# Patient Record
Sex: Male | Born: 1935 | Race: White | Hispanic: No | Marital: Married | Smoking: Never smoker
Health system: Southern US, Community
[De-identification: ages and names within clinical notes are randomized; demographics above are authoritative.]

## PROBLEM LIST (undated history)

## (undated) DIAGNOSIS — C801 Malignant (primary) neoplasm, unspecified: Secondary | ICD-10-CM

## (undated) DIAGNOSIS — G8929 Other chronic pain: Secondary | ICD-10-CM

## (undated) DIAGNOSIS — M359 Systemic involvement of connective tissue, unspecified: Secondary | ICD-10-CM

## (undated) DIAGNOSIS — E119 Type 2 diabetes mellitus without complications: Secondary | ICD-10-CM

## (undated) DIAGNOSIS — I10 Essential (primary) hypertension: Secondary | ICD-10-CM

---

## 2004-07-27 ENCOUNTER — Other Ambulatory Visit: Payer: Self-pay

## 2004-07-27 ENCOUNTER — Emergency Department: Payer: Self-pay | Admitting: Emergency Medicine

## 2007-04-29 ENCOUNTER — Ambulatory Visit: Payer: Self-pay | Admitting: Gastroenterology

## 2010-12-12 ENCOUNTER — Ambulatory Visit: Payer: Self-pay | Admitting: Gastroenterology

## 2011-07-01 ENCOUNTER — Ambulatory Visit: Payer: Self-pay | Admitting: Family Medicine

## 2012-08-27 ENCOUNTER — Emergency Department: Payer: Self-pay | Admitting: Emergency Medicine

## 2012-08-27 LAB — CBC
HCT: 37.8 % — ABNORMAL LOW (ref 40.0–52.0)
HGB: 12.8 g/dL — ABNORMAL LOW (ref 13.0–18.0)
MCH: 33.6 pg (ref 26.0–34.0)
MCHC: 33.9 g/dL (ref 32.0–36.0)
MCV: 99 fL (ref 80–100)
RDW: 15.1 % — ABNORMAL HIGH (ref 11.5–14.5)
WBC: 9.3 10*3/uL (ref 3.8–10.6)

## 2012-08-27 LAB — COMPREHENSIVE METABOLIC PANEL
Albumin: 4.1 g/dL (ref 3.4–5.0)
Anion Gap: 10 (ref 7–16)
BUN: 20 mg/dL — ABNORMAL HIGH (ref 7–18)
Bilirubin,Total: 0.3 mg/dL (ref 0.2–1.0)
Chloride: 105 mmol/L (ref 98–107)
Creatinine: 1.31 mg/dL — ABNORMAL HIGH (ref 0.60–1.30)
EGFR (Non-African Amer.): 51 — ABNORMAL LOW
Glucose: 126 mg/dL — ABNORMAL HIGH (ref 65–99)
Osmolality: 282 (ref 275–301)
Potassium: 4.3 mmol/L (ref 3.5–5.1)
SGOT(AST): 27 U/L (ref 15–37)
Sodium: 139 mmol/L (ref 136–145)

## 2012-09-04 ENCOUNTER — Ambulatory Visit: Payer: Self-pay | Admitting: Family Medicine

## 2012-09-04 LAB — CBC WITH DIFFERENTIAL/PLATELET
Basophil #: 0 10*3/uL (ref 0.0–0.1)
Eosinophil #: 0.1 10*3/uL (ref 0.0–0.7)
Eosinophil %: 0.9 %
HCT: 39 % — ABNORMAL LOW (ref 40.0–52.0)
Lymphocyte #: 1.1 10*3/uL (ref 1.0–3.6)
MCHC: 33.5 g/dL (ref 32.0–36.0)
MCV: 99 fL (ref 80–100)
Monocyte %: 8.8 %
Neutrophil #: 7 10*3/uL — ABNORMAL HIGH (ref 1.4–6.5)
Neutrophil %: 78.3 %
Platelet: 361 10*3/uL (ref 150–440)
RDW: 15.5 % — ABNORMAL HIGH (ref 11.5–14.5)
WBC: 9 10*3/uL (ref 3.8–10.6)

## 2012-09-04 LAB — BASIC METABOLIC PANEL
Anion Gap: 9 (ref 7–16)
Calcium, Total: 9.3 mg/dL (ref 8.5–10.1)
Chloride: 99 mmol/L (ref 98–107)
Co2: 28 mmol/L (ref 21–32)
EGFR (African American): 59 — ABNORMAL LOW
EGFR (Non-African Amer.): 50 — ABNORMAL LOW
Glucose: 124 mg/dL — ABNORMAL HIGH (ref 65–99)
Osmolality: 276 (ref 275–301)
Potassium: 5.1 mmol/L (ref 3.5–5.1)
Sodium: 136 mmol/L (ref 136–145)

## 2012-09-06 ENCOUNTER — Emergency Department: Payer: Self-pay | Admitting: Emergency Medicine

## 2013-10-05 ENCOUNTER — Ambulatory Visit: Payer: Self-pay | Admitting: Physician Assistant

## 2014-02-25 ENCOUNTER — Ambulatory Visit: Payer: Self-pay | Admitting: Rheumatology

## 2014-10-02 ENCOUNTER — Emergency Department: Payer: Self-pay | Admitting: Internal Medicine

## 2017-05-14 ENCOUNTER — Other Ambulatory Visit: Payer: Self-pay | Admitting: Family Medicine

## 2017-05-14 DIAGNOSIS — R109 Unspecified abdominal pain: Secondary | ICD-10-CM

## 2017-05-16 ENCOUNTER — Ambulatory Visit
Admission: RE | Admit: 2017-05-16 | Discharge: 2017-05-16 | Disposition: A | Discharge status: Home / Self Care | Payer: Federal, State, Local not specified - PPO | Source: Ambulatory Visit | Attending: Family Medicine | Admitting: Family Medicine

## 2017-05-16 DIAGNOSIS — I2510 Atherosclerotic heart disease of native coronary artery without angina pectoris: Secondary | ICD-10-CM | POA: Diagnosis not present

## 2017-05-16 DIAGNOSIS — R109 Unspecified abdominal pain: Secondary | ICD-10-CM | POA: Diagnosis present

## 2017-05-16 DIAGNOSIS — I700 Atherosclerosis of aorta: Secondary | ICD-10-CM | POA: Diagnosis not present

## 2017-05-16 DIAGNOSIS — E119 Type 2 diabetes mellitus without complications: Secondary | ICD-10-CM | POA: Insufficient documentation

## 2017-05-16 DIAGNOSIS — M47817 Spondylosis without myelopathy or radiculopathy, lumbosacral region: Secondary | ICD-10-CM | POA: Insufficient documentation

## 2017-05-16 DIAGNOSIS — K5730 Diverticulosis of large intestine without perforation or abscess without bleeding: Secondary | ICD-10-CM | POA: Insufficient documentation

## 2017-05-16 DIAGNOSIS — K760 Fatty (change of) liver, not elsewhere classified: Secondary | ICD-10-CM | POA: Insufficient documentation

## 2019-07-08 ENCOUNTER — Ambulatory Visit
Admission: RE | Admit: 2019-07-08 | Discharge: 2019-07-08 | Disposition: A | Discharge status: Home / Self Care | Payer: Federal, State, Local not specified - PPO | Source: Ambulatory Visit | Attending: Gastroenterology | Admitting: Gastroenterology

## 2019-07-08 ENCOUNTER — Ambulatory Visit
Admission: RE | Admit: 2019-07-08 | Discharge: 2019-07-08 | Disposition: A | Discharge status: Home / Self Care | Payer: Federal, State, Local not specified - PPO | Attending: Gastroenterology | Admitting: Gastroenterology

## 2019-07-08 ENCOUNTER — Other Ambulatory Visit: Payer: Self-pay

## 2019-07-08 ENCOUNTER — Other Ambulatory Visit: Payer: Self-pay | Admitting: Gastroenterology

## 2019-07-08 DIAGNOSIS — K5900 Constipation, unspecified: Secondary | ICD-10-CM | POA: Insufficient documentation

## 2019-08-05 ENCOUNTER — Other Ambulatory Visit: Payer: Self-pay | Admitting: Gastroenterology

## 2019-08-05 DIAGNOSIS — R1084 Generalized abdominal pain: Secondary | ICD-10-CM

## 2019-08-06 ENCOUNTER — Other Ambulatory Visit: Payer: Self-pay

## 2019-08-06 ENCOUNTER — Ambulatory Visit
Admission: RE | Admit: 2019-08-06 | Discharge: 2019-08-06 | Disposition: A | Discharge status: Home / Self Care | Payer: Federal, State, Local not specified - PPO | Source: Ambulatory Visit | Attending: Gastroenterology | Admitting: Gastroenterology

## 2019-08-06 ENCOUNTER — Other Ambulatory Visit: Payer: Self-pay | Admitting: Gastroenterology

## 2019-08-06 DIAGNOSIS — R1084 Generalized abdominal pain: Secondary | ICD-10-CM | POA: Insufficient documentation

## 2019-08-06 HISTORY — DX: Systemic involvement of connective tissue, unspecified: M35.9

## 2019-08-06 HISTORY — DX: Type 2 diabetes mellitus without complications: E11.9

## 2019-08-06 MED ORDER — IOHEXOL 300 MG/ML  SOLN
100.0000 mL | Freq: Once | INTRAMUSCULAR | Status: AC | PRN
  Administered 2019-08-06: 10:00:00 100 mL via INTRAVENOUS

## 2019-10-21 ENCOUNTER — Inpatient Hospital Stay
Admission: EM | Admit: 2019-10-21 | Discharge: 2019-11-03 | DRG: 200 | Disposition: A | Discharge status: Hospice | Payer: Medicare Other | Source: Ambulatory Visit | Attending: Internal Medicine | Admitting: Internal Medicine

## 2019-10-21 ENCOUNTER — Other Ambulatory Visit: Payer: Self-pay

## 2019-10-21 ENCOUNTER — Emergency Department: Payer: Medicare Other

## 2019-10-21 DIAGNOSIS — Z79899 Other long term (current) drug therapy: Secondary | ICD-10-CM

## 2019-10-21 DIAGNOSIS — E1122 Type 2 diabetes mellitus with diabetic chronic kidney disease: Secondary | ICD-10-CM | POA: Diagnosis present

## 2019-10-21 DIAGNOSIS — J939 Pneumothorax, unspecified: Principal | ICD-10-CM

## 2019-10-21 DIAGNOSIS — J90 Pleural effusion, not elsewhere classified: Secondary | ICD-10-CM

## 2019-10-21 DIAGNOSIS — Z8719 Personal history of other diseases of the digestive system: Secondary | ICD-10-CM

## 2019-10-21 DIAGNOSIS — Z6824 Body mass index (BMI) 24.0-24.9, adult: Secondary | ICD-10-CM

## 2019-10-21 DIAGNOSIS — K810 Acute cholecystitis: Secondary | ICD-10-CM

## 2019-10-21 DIAGNOSIS — Z85828 Personal history of other malignant neoplasm of skin: Secondary | ICD-10-CM

## 2019-10-21 DIAGNOSIS — N183 Chronic kidney disease, stage 3 unspecified: Secondary | ICD-10-CM | POA: Diagnosis present

## 2019-10-21 DIAGNOSIS — G629 Polyneuropathy, unspecified: Secondary | ICD-10-CM | POA: Diagnosis present

## 2019-10-21 DIAGNOSIS — N1831 Chronic kidney disease, stage 3a: Secondary | ICD-10-CM | POA: Diagnosis not present

## 2019-10-21 DIAGNOSIS — N179 Acute kidney failure, unspecified: Secondary | ICD-10-CM | POA: Diagnosis present

## 2019-10-21 DIAGNOSIS — R0602 Shortness of breath: Secondary | ICD-10-CM

## 2019-10-21 DIAGNOSIS — E440 Moderate protein-calorie malnutrition: Secondary | ICD-10-CM | POA: Diagnosis present

## 2019-10-21 DIAGNOSIS — M069 Rheumatoid arthritis, unspecified: Secondary | ICD-10-CM | POA: Diagnosis present

## 2019-10-21 DIAGNOSIS — K5909 Other constipation: Secondary | ICD-10-CM | POA: Diagnosis present

## 2019-10-21 DIAGNOSIS — Z803 Family history of malignant neoplasm of breast: Secondary | ICD-10-CM

## 2019-10-21 DIAGNOSIS — T859XXA Unspecified complication of internal prosthetic device, implant and graft, initial encounter: Secondary | ICD-10-CM

## 2019-10-21 DIAGNOSIS — I10 Essential (primary) hypertension: Secondary | ICD-10-CM | POA: Diagnosis present

## 2019-10-21 DIAGNOSIS — Z8619 Personal history of other infectious and parasitic diseases: Secondary | ICD-10-CM

## 2019-10-21 DIAGNOSIS — J984 Other disorders of lung: Secondary | ICD-10-CM

## 2019-10-21 DIAGNOSIS — Z7984 Long term (current) use of oral hypoglycemic drugs: Secondary | ICD-10-CM

## 2019-10-21 DIAGNOSIS — D62 Acute posthemorrhagic anemia: Secondary | ICD-10-CM | POA: Diagnosis not present

## 2019-10-21 DIAGNOSIS — K567 Ileus, unspecified: Secondary | ICD-10-CM | POA: Diagnosis present

## 2019-10-21 DIAGNOSIS — D473 Essential (hemorrhagic) thrombocythemia: Secondary | ICD-10-CM | POA: Diagnosis not present

## 2019-10-21 DIAGNOSIS — Z09 Encounter for follow-up examination after completed treatment for conditions other than malignant neoplasm: Secondary | ICD-10-CM

## 2019-10-21 DIAGNOSIS — D72829 Elevated white blood cell count, unspecified: Secondary | ICD-10-CM | POA: Diagnosis not present

## 2019-10-21 DIAGNOSIS — M0519 Rheumatoid lung disease with rheumatoid arthritis of multiple sites: Secondary | ICD-10-CM | POA: Diagnosis present

## 2019-10-21 DIAGNOSIS — Z66 Do not resuscitate: Secondary | ICD-10-CM | POA: Diagnosis present

## 2019-10-21 DIAGNOSIS — F05 Delirium due to known physiological condition: Secondary | ICD-10-CM | POA: Diagnosis not present

## 2019-10-21 DIAGNOSIS — Z20822 Contact with and (suspected) exposure to covid-19: Secondary | ICD-10-CM | POA: Diagnosis present

## 2019-10-21 DIAGNOSIS — I129 Hypertensive chronic kidney disease with stage 1 through stage 4 chronic kidney disease, or unspecified chronic kidney disease: Secondary | ICD-10-CM | POA: Diagnosis present

## 2019-10-21 DIAGNOSIS — Z781 Physical restraint status: Secondary | ICD-10-CM

## 2019-10-21 DIAGNOSIS — J9811 Atelectasis: Secondary | ICD-10-CM | POA: Diagnosis present

## 2019-10-21 DIAGNOSIS — K920 Hematemesis: Secondary | ICD-10-CM | POA: Diagnosis present

## 2019-10-21 DIAGNOSIS — G8929 Other chronic pain: Secondary | ICD-10-CM | POA: Diagnosis present

## 2019-10-21 DIAGNOSIS — Z87891 Personal history of nicotine dependence: Secondary | ICD-10-CM

## 2019-10-21 DIAGNOSIS — J9382 Other air leak: Secondary | ICD-10-CM | POA: Diagnosis not present

## 2019-10-21 DIAGNOSIS — Z515 Encounter for palliative care: Secondary | ICD-10-CM | POA: Diagnosis not present

## 2019-10-21 DIAGNOSIS — Z9689 Presence of other specified functional implants: Secondary | ICD-10-CM

## 2019-10-21 DIAGNOSIS — Z8249 Family history of ischemic heart disease and other diseases of the circulatory system: Secondary | ICD-10-CM

## 2019-10-21 DIAGNOSIS — Z7952 Long term (current) use of systemic steroids: Secondary | ICD-10-CM

## 2019-10-21 DIAGNOSIS — J910 Malignant pleural effusion: Secondary | ICD-10-CM | POA: Diagnosis present

## 2019-10-21 DIAGNOSIS — Z882 Allergy status to sulfonamides status: Secondary | ICD-10-CM

## 2019-10-21 DIAGNOSIS — M549 Dorsalgia, unspecified: Secondary | ICD-10-CM | POA: Diagnosis present

## 2019-10-21 DIAGNOSIS — K828 Other specified diseases of gallbladder: Secondary | ICD-10-CM | POA: Diagnosis present

## 2019-10-21 DIAGNOSIS — E875 Hyperkalemia: Secondary | ICD-10-CM | POA: Diagnosis present

## 2019-10-21 HISTORY — DX: Essential (primary) hypertension: I10

## 2019-10-21 HISTORY — DX: Other chronic pain: G89.29

## 2019-10-21 HISTORY — DX: Malignant (primary) neoplasm, unspecified: C80.1

## 2019-10-21 LAB — BASIC METABOLIC PANEL
Anion gap: 15 (ref 5–15)
BUN: 26 mg/dL — ABNORMAL HIGH (ref 8–23)
CO2: 25 mmol/L (ref 22–32)
Calcium: 8.7 mg/dL — ABNORMAL LOW (ref 8.9–10.3)
Chloride: 96 mmol/L — ABNORMAL LOW (ref 98–111)
Creatinine, Ser: 1.44 mg/dL — ABNORMAL HIGH (ref 0.61–1.24)
GFR calc Af Amer: 51 mL/min — ABNORMAL LOW (ref >60)
GFR calc non Af Amer: 44 mL/min — ABNORMAL LOW (ref >60)
Glucose, Bld: 195 mg/dL — ABNORMAL HIGH (ref 70–99)
Potassium: 5.4 mmol/L — ABNORMAL HIGH (ref 3.5–5.1)
Sodium: 136 mmol/L (ref 135–145)

## 2019-10-21 LAB — CBC
HCT: 32.9 % — ABNORMAL LOW (ref 39.0–52.0)
Hemoglobin: 10.4 g/dL — ABNORMAL LOW (ref 13.0–17.0)
MCH: 29.6 pg (ref 26.0–34.0)
MCHC: 31.6 g/dL (ref 30.0–36.0)
MCV: 93.7 fL (ref 80.0–100.0)
Platelets: 613 10*3/uL — ABNORMAL HIGH (ref 150–400)
RBC: 3.51 MIL/uL — ABNORMAL LOW (ref 4.22–5.81)
RDW: 12.4 % (ref 11.5–15.5)
WBC: 13.8 10*3/uL — ABNORMAL HIGH (ref 4.0–10.5)
nRBC: 0 % (ref 0.0–0.2)

## 2019-10-21 LAB — APTT
aPTT: 32 seconds (ref 24–36)
aPTT: 32 seconds (ref 24–36)

## 2019-10-21 LAB — GLUCOSE, CAPILLARY: Glucose-Capillary: 165 mg/dL — ABNORMAL HIGH (ref 70–99)

## 2019-10-21 LAB — RESPIRATORY PANEL BY RT PCR (FLU A&B, COVID)
Influenza A by PCR: NEGATIVE
Influenza B by PCR: NEGATIVE
SARS Coronavirus 2 by RT PCR: NEGATIVE

## 2019-10-21 LAB — BODY FLUID CELL COUNT WITH DIFFERENTIAL
Eos, Fluid: 1 %
Lymphs, Fluid: 51 %
Monocyte-Macrophage-Serous Fluid: 10 %
Neutrophil Count, Fluid: 38 %
Total Nucleated Cell Count, Fluid: 995 cu mm

## 2019-10-21 LAB — PROTIME-INR
INR: 1.1 (ref 0.8–1.2)
Prothrombin Time: 14.5 seconds (ref 11.4–15.2)

## 2019-10-21 LAB — AMYLASE, PLEURAL OR PERITONEAL FLUID: Amylase, Fluid: 43 U/L

## 2019-10-21 LAB — LACTATE DEHYDROGENASE, PLEURAL OR PERITONEAL FLUID: LD, Fluid: 402 U/L — ABNORMAL HIGH (ref 3–23)

## 2019-10-21 LAB — GLUCOSE, PLEURAL OR PERITONEAL FLUID: Glucose, Fluid: 108 mg/dL

## 2019-10-21 LAB — TROPONIN I (HIGH SENSITIVITY)
Troponin I (High Sensitivity): 8 ng/L (ref <18)
Troponin I (High Sensitivity): 7 ng/L (ref <18)

## 2019-10-21 LAB — PROTEIN, PLEURAL OR PERITONEAL FLUID: Total protein, fluid: 4.4 g/dL

## 2019-10-21 LAB — LACTATE DEHYDROGENASE: LDH: 128 U/L (ref 98–192)

## 2019-10-21 MED ORDER — SODIUM ZIRCONIUM CYCLOSILICATE 5 G PO PACK
5.0000 g | PACK | Freq: Once | ORAL | Status: AC
  Administered 2019-10-21: 5 g via ORAL
  Filled 2019-10-21: qty 1

## 2019-10-21 MED ORDER — MORPHINE SULFATE (PF) 2 MG/ML IV SOLN
1.0000 mg | INTRAVENOUS | Status: DC | PRN
  Administered 2019-10-21 (×2): 1 mg via INTRAVENOUS
  Filled 2019-10-21 (×2): qty 1

## 2019-10-21 MED ORDER — OXYCODONE-ACETAMINOPHEN 5-325 MG PO TABS
2.0000 | ORAL_TABLET | Freq: Four times a day (QID) | ORAL | Status: DC | PRN
  Administered 2019-10-22 (×2): 2 via ORAL
  Filled 2019-10-21 (×2): qty 2

## 2019-10-21 MED ORDER — ONDANSETRON HCL 4 MG/2ML IJ SOLN
4.0000 mg | Freq: Three times a day (TID) | INTRAMUSCULAR | Status: DC | PRN
  Administered 2019-10-22 – 2019-10-23 (×2): 4 mg via INTRAVENOUS
  Filled 2019-10-21 (×3): qty 2

## 2019-10-21 MED ORDER — ACETAMINOPHEN 325 MG PO TABS
650.0000 mg | ORAL_TABLET | Freq: Four times a day (QID) | ORAL | Status: DC | PRN
  Administered 2019-10-29 – 2019-10-31 (×2): 650 mg via ORAL
  Filled 2019-10-21 (×3): qty 2

## 2019-10-21 MED ORDER — SODIUM CHLORIDE 0.9% FLUSH
3.0000 mL | Freq: Once | INTRAVENOUS | Status: AC
  Administered 2019-10-31: 3 mL via INTRAVENOUS

## 2019-10-21 MED ORDER — LINACLOTIDE 145 MCG PO CAPS
145.0000 ug | ORAL_CAPSULE | Freq: Every morning | ORAL | Status: DC
  Administered 2019-10-22 – 2019-11-02 (×9): 145 ug via ORAL
  Filled 2019-10-21 (×12): qty 1

## 2019-10-21 MED ORDER — HYDROCORTISONE NA SUCCINATE PF 100 MG IJ SOLR
100.0000 mg | Freq: Once | INTRAMUSCULAR | Status: AC
  Administered 2019-10-21: 100 mg via INTRAVENOUS
  Filled 2019-10-21: qty 2

## 2019-10-21 MED ORDER — HYDRALAZINE HCL 25 MG PO TABS
25.0000 mg | ORAL_TABLET | Freq: Three times a day (TID) | ORAL | Status: DC | PRN
  Administered 2019-10-21: 25 mg via ORAL
  Filled 2019-10-21 (×2): qty 1

## 2019-10-21 MED ORDER — OXYCODONE-ACETAMINOPHEN 5-325 MG PO TABS
1.0000 | ORAL_TABLET | ORAL | Status: DC | PRN
  Administered 2019-10-21 (×2): 1 via ORAL
  Filled 2019-10-21 (×2): qty 1

## 2019-10-21 MED ORDER — POLYETHYLENE GLYCOL 3350 17 G PO PACK
17.0000 g | PACK | Freq: Every day | ORAL | Status: DC | PRN
  Filled 2019-10-21 (×2): qty 1

## 2019-10-21 MED ORDER — HYDROMORPHONE HCL 1 MG/ML IJ SOLN: 1.0000 mg | INTRAMUSCULAR | Status: AC

## 2019-10-21 MED ORDER — FINASTERIDE 5 MG PO TABS
5.0000 mg | ORAL_TABLET | Freq: Every day | ORAL | Status: DC
  Administered 2019-10-21 – 2019-11-02 (×11): 5 mg via ORAL
  Filled 2019-10-21 (×11): qty 1

## 2019-10-21 MED ORDER — ALBUTEROL SULFATE HFA 108 (90 BASE) MCG/ACT IN AERS
2.0000 | INHALATION_SPRAY | RESPIRATORY_TRACT | Status: DC | PRN
  Filled 2019-10-21: qty 6.7

## 2019-10-21 MED ORDER — HYDROMORPHONE HCL 1 MG/ML IJ SOLN
INTRAMUSCULAR | Status: AC
  Administered 2019-10-21: 1 mg via INTRAVENOUS
  Filled 2019-10-21: qty 1

## 2019-10-21 MED ORDER — INSULIN ASPART 100 UNIT/ML ~~LOC~~ SOLN
0.0000 [IU] | Freq: Every day | SUBCUTANEOUS | Status: DC
  Administered 2019-10-24: 2 [IU] via SUBCUTANEOUS
  Administered 2019-10-25: 3 [IU] via SUBCUTANEOUS
  Administered 2019-10-26: 4 [IU] via SUBCUTANEOUS
  Administered 2019-10-27: 3 [IU] via SUBCUTANEOUS
  Administered 2019-10-28: 2 [IU] via SUBCUTANEOUS
  Administered 2019-10-29: 4 [IU] via SUBCUTANEOUS
  Administered 2019-10-30 – 2019-10-31 (×2): 2 [IU] via SUBCUTANEOUS
  Administered 2019-11-01: 4 [IU] via SUBCUTANEOUS
  Filled 2019-10-21 (×10): qty 1

## 2019-10-21 MED ORDER — MORPHINE SULFATE (PF) 2 MG/ML IV SOLN
1.0000 mg | INTRAVENOUS | Status: DC | PRN
  Administered 2019-10-22 – 2019-10-23 (×3): 1 mg via INTRAVENOUS
  Filled 2019-10-21 (×3): qty 1

## 2019-10-21 MED ORDER — TOFACITINIB CITRATE 5 MG PO TABS
5.0000 mg | ORAL_TABLET | Freq: Two times a day (BID) | ORAL | Status: DC
  Administered 2019-10-29 – 2019-11-02 (×7): 5 mg via ORAL
  Filled 2019-10-21 (×8): qty 1

## 2019-10-21 MED ORDER — LIDOCAINE-EPINEPHRINE 2 %-1:100000 IJ SOLN
30.0000 mL | Freq: Once | INTRAMUSCULAR | Status: AC
  Administered 2019-10-21: 30 mL
  Filled 2019-10-21: qty 2

## 2019-10-21 MED ORDER — INSULIN ASPART 100 UNIT/ML ~~LOC~~ SOLN
0.0000 [IU] | Freq: Three times a day (TID) | SUBCUTANEOUS | Status: DC
  Administered 2019-10-22: 3 [IU] via SUBCUTANEOUS
  Administered 2019-10-22: 1 [IU] via SUBCUTANEOUS
  Administered 2019-10-23 – 2019-10-24 (×3): 2 [IU] via SUBCUTANEOUS
  Administered 2019-10-24: 5 [IU] via SUBCUTANEOUS
  Administered 2019-10-25: 3 [IU] via SUBCUTANEOUS
  Administered 2019-10-25: 2 [IU] via SUBCUTANEOUS
  Administered 2019-10-25: 5 [IU] via SUBCUTANEOUS
  Administered 2019-10-26: 1 [IU] via SUBCUTANEOUS
  Administered 2019-10-26: 3 [IU] via SUBCUTANEOUS
  Administered 2019-10-26: 5 [IU] via SUBCUTANEOUS
  Administered 2019-10-27: 2 [IU] via SUBCUTANEOUS
  Administered 2019-10-27: 5 [IU] via SUBCUTANEOUS
  Administered 2019-10-27: 3 [IU] via SUBCUTANEOUS
  Administered 2019-10-28: 1 [IU] via SUBCUTANEOUS
  Administered 2019-10-28: 3 [IU] via SUBCUTANEOUS
  Administered 2019-10-28: 5 [IU] via SUBCUTANEOUS
  Administered 2019-10-29: 2 [IU] via SUBCUTANEOUS
  Administered 2019-10-29: 7 [IU] via SUBCUTANEOUS
  Administered 2019-10-29 – 2019-10-31 (×5): 2 [IU] via SUBCUTANEOUS
  Administered 2019-10-31 (×2): 5 [IU] via SUBCUTANEOUS
  Administered 2019-11-01: 2 [IU] via SUBCUTANEOUS
  Administered 2019-11-01: 3 [IU] via SUBCUTANEOUS
  Administered 2019-11-01 – 2019-11-02 (×2): 2 [IU] via SUBCUTANEOUS
  Filled 2019-10-21 (×32): qty 1

## 2019-10-21 MED ORDER — PREDNISONE 20 MG PO TABS
20.0000 mg | ORAL_TABLET | Freq: Every day | ORAL | Status: DC
  Administered 2019-10-22 – 2019-11-02 (×10): 20 mg via ORAL
  Filled 2019-10-21 (×10): qty 1

## 2019-10-21 MED ORDER — DM-GUAIFENESIN ER 30-600 MG PO TB12
1.0000 | ORAL_TABLET | Freq: Two times a day (BID) | ORAL | Status: DC
  Administered 2019-10-21 – 2019-11-03 (×17): 1 via ORAL
  Filled 2019-10-21 (×28): qty 1

## 2019-10-21 NOTE — ED Notes (Addendum)
Pt c/o pain in left side, requesting prn pain medication, pt advised that he was given prn morphine approx an hour ago

## 2019-10-21 NOTE — H&P (Signed)
History and Physical    David Cisneros [id number] DOB: Nov 22, 1930 DOA: 10/21/2019  Referring MD/NP/PA:   PCP: Valera Castle, MD   Patient coming from:  The patient is coming from home.  At baseline, pt is independent for most of ADL.        Chief Complaint: Shortness of breath  HPI: David Cisneros is a 85 y.o. male with medical history significant of former smoker, hypertension, diabetes mellitus, rheumatoid arthritis, chronic back pain, CKD stage IIIa, who presents with shortness of breath.  Patient states that he has been having shortness of breath for more than 2 weeks, which has worsened in the past several days. Initially no chest pain reported to ED physician, but developed chest pain after chest tubes were placed on the left side.  No cough, fever or chills.  Patient is constipated, but no nausea, vomiting, diarrhea or abdominal pain.  No symptoms of UTI.  No unilateral weakness. Patient was found to have a large left-sided pleural effusion by CXR. Left side chest tube is placed in ED.   ED Course: pt was found to have WBC 13.8, negative RVP for COVID-19 test, potassium 5.4, slightly worsening renal function, temperature normal, blood pressure 180/81, tachycardia, oxygen sat 96% 2 L nasal cannula oxygen. Pt is based on stepdown bed for observation.  # CT-chest showed: large left pleural effusion with compressive atelectasis of the majority of the left lung.  Review of Systems:   General: no fevers, chills, no body weight gain, has fatigue HEENT: no blurry vision, hearing changes or sore throat Respiratory: has dyspnea, no coughing, wheezing CV: has chest pain, no palpitations GI: no nausea, vomiting, abdominal pain, diarrhea, constipation GU: no dysuria, burning on urination, increased urinary frequency, hematuria  Ext: no leg edema Neuro: no unilateral weakness, numbness, or tingling, no vision change or hearing loss Skin: no rash, no skin tear. MSK: No muscle  spasm, no deformity, no limitation of range of movement in spin Heme: No easy bruising.  Travel history: No recent long distant travel.  Allergy:  Allergies  Allergen Reactions  . Sulfa Antibiotics Rash    Past Medical History:  Diagnosis Date  . Cancer ([facility])    skin   . Chronic back pain   . Collagen vascular disease (HCC)    RA  . Diabetes mellitus without complication ([place])   . Hypertension     History reviewed. No pertinent surgical history.  Social History:  reports that he has never smoked. He has never used smokeless tobacco. He reports previous alcohol use. He reports previous drug use.  Family History:  Family History  Problem Relation Age of Onset  . Breast cancer Sister   . Heart attack Brother      Prior to Admission medications   Not on File    Physical Exam: Vitals:   10/21/19 1400 10/21/19 1426 10/21/19 1430 10/21/19 1500  BP: (!) 180/81  (!) 198/95 (!) 191/98  Pulse: 96  94 93  Resp: (!) 21  (!) 29 14  Temp:  98.1 F (36.7 C)    TempSrc:      SpO2: 96%  92% 93%  Weight:      Height:       General: Not in acute distress HEENT:       Eyes: PERRL, EOMI, no scleral icterus.       ENT: No discharge from the ears and nose, no pharynx injection, no tonsillar enlargement.  Neck: No JVD, no bruit, no mass felt. Heme: No neck lymph node enlargement. Cardiac: S1/S2, RRR, No murmurs, No gallops or rubs. Respiratory: Decreased air movement on the left side. S/p of chest tube placement. GI: Soft, nondistended, nontender, no rebound pain, no organomegaly, BS present. GU: No hematuria Ext: No pitting leg edema bilaterally. 2+DP/PT pulse bilaterally. Musculoskeletal: No joint deformities, No joint redness or warmth, no limitation of ROM in spin. Skin: No rashes.  Neuro: Alert, oriented X3, cranial nerves II-XII grossly intact, moves all extremities normally.  Psych: Patient is not psychotic, no suicidal or hemocidal ideation.  Labs on Admission: I  have personally reviewed following labs and imaging studies  CBC: Recent Labs  Lab 10/21/19 1054  WBC 13.8*  HGB 10.4*  HCT 32.9*  MCV 93.7  PLT 123XX123*   Basic Metabolic Panel: Recent Labs  Lab 10/21/19 1054  NA 136  K 5.4*  CL 96*  CO2 25  GLUCOSE 195*  BUN 26*  CREATININE 1.44*  CALCIUM 8.7*   GFR: Estimated Creatinine Clearance: 32 mL/min (A) (by C-G formula based on SCr of 1.44 mg/dL (H)). Liver Function Tests: No results for input(s): AST, ALT, ALKPHOS, BILITOT, PROT, ALBUMIN in the last 168 hours. No results for input(s): LIPASE, AMYLASE in the last 168 hours. No results for input(s): AMMONIA in the last 168 hours. Coagulation Profile: Recent Labs  Lab 10/21/19 1353  INR 1.1   Cardiac Enzymes: No results for input(s): CKTOTAL, CKMB, CKMBINDEX, TROPONINI in the last 168 hours. BNP (last 3 results) No results for input(s): PROBNP in the last 8760 hours. HbA1C: No results for input(s): HGBA1C in the last 72 hours. CBG: No results for input(s): GLUCAP in the last 168 hours. Lipid Profile: No results for input(s): CHOL, HDL, LDLCALC, TRIG, CHOLHDL, LDLDIRECT in the last 72 hours. Thyroid Function Tests: No results for input(s): TSH, T4TOTAL, FREET4, T3FREE, THYROIDAB in the last 72 hours. Anemia Panel: No results for input(s): VITAMINB12, FOLATE, FERRITIN, TIBC, IRON, RETICCTPCT in the last 72 hours. Urine analysis: No results found for: COLORURINE, APPEARANCEUR, LABSPEC, PHURINE, GLUCOSEU, HGBUR, BILIRUBINUR, KETONESUR, PROTEINUR, UROBILINOGEN, NITRITE, LEUKOCYTESUR Sepsis Labs: @LABRCNTIP (procalcitonin:4,lacticidven:4) ) Recent Results (from the past 240 hour(s))  Respiratory Panel by RT PCR (Flu A&B, Covid) - Nasopharyngeal Swab     Status: None   Collection Time: 10/21/19 12:33 PM   Specimen: Nasopharyngeal Swab  Result Value Ref Range Status   SARS Coronavirus 2 by RT PCR NEGATIVE NEGATIVE Final    Comment: (NOTE) SARS-CoV-2 target nucleic acids are  NOT DETECTED. The SARS-CoV-2 RNA is generally detectable in upper respiratoy specimens during the acute phase of infection. The lowest concentration of SARS-CoV-2 viral copies this assay can detect is 131 copies/mL. A negative result does not preclude SARS-Cov-2 infection and should not be used as the sole basis for treatment or other patient management decisions. A negative result may occur with  improper specimen collection/handling, submission of specimen other than nasopharyngeal swab, presence of viral mutation(s) within the areas targeted by this assay, and inadequate number of viral copies (<131 copies/mL). A negative result must be combined with clinical observations, patient history, and epidemiological information. The expected result is Negative. Fact Sheet for Patients:  PinkCheek.be Fact Sheet for Healthcare Providers:  GravelBags.it This test is not yet ap proved or cleared by the [place] FDA and  has been authorized for detection and/or diagnosis of SARS-CoV-2 by FDA under an Emergency Use Authorization (EUA). This EUA will remain  in effect (meaning this  test can be used) for the duration of the COVID-19 declaration under Section 564(b)(1) of the Act, 21 U.S.C. section 360bbb-3(b)(1), unless the authorization is terminated or revoked sooner.    Influenza A by PCR NEGATIVE NEGATIVE Final   Influenza B by PCR NEGATIVE NEGATIVE Final    Comment: (NOTE) The Xpert Xpress SARS-CoV-2/FLU/RSV assay is intended as an aid in  the diagnosis of influenza from Nasopharyngeal swab specimens and  should not be used as a sole basis for treatment. Nasal washings and  aspirates are unacceptable for Xpert Xpress SARS-CoV-2/FLU/RSV  testing. Fact Sheet for Patients: PinkCheek.be Fact Sheet for Healthcare Providers: GravelBags.it This test is not yet approved or  cleared by the [place] FDA and  has been authorized for detection and/or diagnosis of SARS-CoV-2 by  FDA under an Emergency Use Authorization (EUA). This EUA will remain  in effect (meaning this test can be used) for the duration of the  Covid-19 declaration under Section 564(b)(1) of the Act, 21  U.S.C. section 360bbb-3(b)(1), unless the authorization is  terminated or revoked. Performed at [facility], [address] [place], [place] [address]      Radiological Exams on Admission: DG Chest 2 View  Result Date: 10/21/2019 CLINICAL DATA:  85 year old male with shortness of breath. Patient is not able to raise his left arm. EXAM: CHEST - 2 VIEW COMPARISON:  Chest radiograph dated 10/02/2014. FINDINGS: There is a large left pleural effusion with compressive atelectasis of the majority of the left lung. A small aerated portion of the left upper lobe remains. The right lung is clear. No pneumothorax. The cardiac borders are silhouetted. Atherosclerotic calcification of the aorta. No acute osseous pathology. IMPRESSION: Large left pleural effusion with compressive atelectasis of the majority of the left lung. Pneumonia is not excluded. Electronically Signed   By: Anner Crete M.D.   On: 10/21/2019 11:24   CT Chest Wo Contrast  Result Date: 10/21/2019 CLINICAL DATA:  Abnormal x-ray EXAM: CT CHEST WITHOUT CONTRAST TECHNIQUE: Multidetector CT imaging of the chest was performed following the standard protocol without IV contrast. COMPARISON:  None. FINDINGS: Cardiovascular: Normal heart size. Coronary artery calcification. No significant pericardial effusion. Calcified plaque along the thoracic aorta. Mediastinum/Nodes: Rightward mediastinal shift. No mediastinal or axillary adenopathy. Unremarkable thyroid. Lungs/Pleura: Large left pleural effusion with near complete atelectasis of the left lung. There is partial aeration of the left upper lobe. Right lung remains aerated with  chronic interstitial changes at the lung base. Upper Abdomen: No acute abnormality. Musculoskeletal: No chest wall mass or significant osseous abnormality. IMPRESSION: Large left pleural effusion with compressive atelectasis of the majority of the left lung. Aortic Atherosclerosis (ICD10-I70.0). Electronically Signed   By: Macy Mis M.D.   On: 10/21/2019 12:35   DG Chest Portable 1 View  Result Date: 10/21/2019 CLINICAL DATA:  Left-sided chest tube placement. EXAM: PORTABLE CHEST 1 VIEW COMPARISON:  CT chest and chest x-ray from same day. FINDINGS: New left-sided chest tube at the lung base. Resolved left pleural effusion. Small left pneumothorax. Hazy density throughout the left lung. The right lung is clear. Normal heart size. No acute osseous abnormality. IMPRESSION: 1. Interval placement of a left-sided chest tube with resolved left pleural effusion. 2. Small left pneumothorax, presumably ex vacuo due to incomplete re-expansion of the left lung. 3. Hazy density throughout the left lung could reflect atelectasis and/or re-expansion pulmonary edema. Electronically Signed   By: Titus Dubin M.D.   On: 10/21/2019 14:11  EKG: Independently reviewed.  Sinus rhythm, QTC 430, LAE, low voltage  Assessment/Plan Principal Problem:   Pleural effusion on left Active Problems:   Hyperkalemia   Hypertension   Chronic back pain   CKD (chronic kidney disease), stage IIIa   Leukocytosis   Pneumothorax on left   Rheumatoid arthritis (HCC)   Pleural effusion on left: Etiology is not clear.  Patient is s/p of left chest tube placement. Per EDP, approximately 3 L of fluid were removed. -place on SDU for obs -pain control: prn Percocet, morphine and Tylenol -As needed albuterol inhaler -As needed Mucinex for cough -f/u pleural fluid analysis  Hyperkalemia: K 5.4 -give 5 g of Lokelma  Hypertension: Blood pressure is elevated 180/81, likely due to pain.  Patient is not taking medication for  blood pressure at home. -prn hydralazine -may start oral med if blood pressure elevation is a persistent  Chronic back pain: -pt is on pain meds as above which should cover  CKD (chronic kidney disease), stage IIIa: Close to baseline.  Baseline creatinine 1.33.  His creatinine is 1.44, BUN 26. -f/u by BMP  Leukocytosis: WBC  13.8. no fever.  May be due to steroid use. -f/u by CBC -f/u Blood culture  Rheumatoid arthritis: Patient is taking Morrie Sheldon and prednisone at home.  -will continue home meds -increased prednisone dose from 10 to 20 mg daily -Give Solu-Cortef 100 mg x 1 as a stress dose  Pneumothorax on left: small.  -f/p of chest tube    DVT ppx: SCD Code Status: Full code Family Communication: None at bed side.   Disposition Plan:  Anticipate discharge back to previous home environment Consults called:  none Admission status:  SDU/obs  Date of Service 10/21/2019    [facility]   If 7PM-7AM, please contact night-coverage [email or web address] Password [email or web address] 10/21/2019, 3:40 PM

## 2019-10-21 NOTE — ED Triage Notes (Signed)
Pt sent from MD office, states he was there today for injections in his back for chronic pain states he was sent here due to SOB for the past 2 weeks and HTN, tachycardia. Pt is in NAD> pt c/o chronic pain in back and neck at present.

## 2019-10-21 NOTE — ED Notes (Signed)
Pt requesting pain medication, prn morphine given

## 2019-10-21 NOTE — ED Provider Notes (Signed)
[facility] Emergency Department Provider Note  ____________________________________________  Time seen: Approximately 2:44 PM  I have reviewed the triage vital signs and the nursing notes.   HISTORY  Chief Complaint Shortness of Breath    HPI MAHYAR VINK is a 85 y.o. 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  Denies chest pain or fever.  No body aches or sick contacts.     Past Medical History:  Diagnosis Date  . Cancer ([place])    skin   . Chronic back pain   . Collagen vascular disease ([facility])    RA  . Diabetes mellitus without complication ([facility])   . Hypertension      Patient Active Problem List   Diagnosis Date Noted  . Pleural effusion on left 10/21/2019  . Hyperkalemia 10/21/2019  . Pneumothorax on left 10/21/2019  . Hypertension   . Chronic back pain   . CKD (chronic kidney disease), stage IIIa   . Leukocytosis      History reviewed. No pertinent surgical history.   Prior to Admission medications   Medication Sig Start Date End Date Taking? Authorizing Provider  metFORMIN (GLUCOPHAGE) 500 MG tablet Take 500 mg by mouth 2 (two) times daily. 09/28/19  Yes [provider]  predniSONE (DELTASONE) 5 MG tablet Take 10 mg by mouth daily. 10/09/19  Yes [provider]  XELJANZ 5 MG TABS Take 5 mg by mouth 2 (two) times daily. 10/06/19  Yes [provider]  finasteride (PROSCAR) 5 MG tablet Take 5 mg by mouth daily. 09/11/19   [provider]  gabapentin (NEURONTIN) 100 MG capsule Take 100 mg by mouth at bedtime. 09/30/19   [provider]  glimepiride (AMARYL) 4 MG tablet Take 4 mg by mouth 2 (two) times daily. 09/21/19   [provider]  LINZESS 145 MCG CAPS  capsule Take 145 mcg by mouth every morning. 10/01/19   [provider]     Allergies Sulfa antibiotics   No family history on file.  Social History Social History   Tobacco Use  . Smoking status: Never Smoker  . Smokeless tobacco: Never Used  Substance Use Topics  . Alcohol use: Not Currently  . Drug use: Not Currently    Review of Systems  Constitutional:   No fever or chills.  ENT:   No sore throat. No rhinorrhea. Cardiovascular:   No chest pain or syncope. Respiratory: Positive shortness of breath without cough. Gastrointestinal:   Negative for abdominal pain, vomiting and diarrhea.  Musculoskeletal:   Negative for focal pain or swelling All other systems reviewed and are negative except as documented above in ROS and HPI.  ____________________________________________   PHYSICAL EXAM:  VITAL SIGNS: ED Triage Vitals  Enc Vitals Group     BP 10/21/19 1051 (!) 151/86     Pulse Rate 10/21/19 1049 (!) 103     Resp 10/21/19 1049 18     Temp 10/21/19 1426 98.1 F (36.7 C)     Temp Source 10/21/19 1049 Oral     SpO2 10/21/19 1049 96 %     Weight 10/21/19 1050 160 lb (72.6 kg)     Height 10/21/19 1050 5\' 5"  (1.651 m)     Head Circumference --  Peak Flow --      Pain Score 10/21/19 1050 8     Pain Loc --      Pain Edu? --      Excl. in [place]? --     Vital signs reviewed, nursing assessments reviewed.   Constitutional:   Alert and oriented.  Mild respiratory distress. Eyes:   Conjunctivae are normal. EOMI. PERRL. ENT      Head:   Normocephalic and atraumatic.      Nose:   Wearing a mask.      Mouth/Throat:   Wearing a mask.      Neck:   No meningismus. Full ROM. Hematological/Lymphatic/Immunilogical:   No cervical lymphadenopathy. Cardiovascular:   RRR. Symmetric bilateral radial and DP pulses.  No murmurs. Cap refill less than 2 seconds. Respiratory: Tachypnea.  Diminished breath sounds in upper left lung field, absent breath sounds in the lower  left lung fields.  Normal breath sounds on the right. Gastrointestinal:   Soft and nontender. Non distended. There is no CVA tenderness.  No rebound, rigidity, or guarding.  Musculoskeletal:   Normal range of motion in all extremities. No joint effusions.  No lower extremity tenderness.  No edema. Neurologic:   Normal speech and language.  Motor grossly intact. No acute focal neurologic deficits are appreciated.  Skin:    Skin is warm, dry and intact. No rash noted.  No petechiae, purpura, or bullae.  ____________________________________________    LABS (pertinent positives/negatives) (all labs ordered are listed, but only abnormal results are displayed) Labs Reviewed  BASIC METABOLIC PANEL - Abnormal; Notable for the following components:      Result Value   Potassium 5.4 (*)    Chloride 96 (*)    Glucose, Bld 195 (*)    BUN 26 (*)    Creatinine, Ser 1.44 (*)    Calcium 8.7 (*)    GFR calc non Af Amer 44 (*)    GFR calc Af Amer 51 (*)    All other components within normal limits  CBC - Abnormal; Notable for the following components:   WBC 13.8 (*)    RBC 3.51 (*)    Hemoglobin 10.4 (*)    HCT 32.9 (*)    Platelets 613 (*)    All other components within normal limits  RESPIRATORY PANEL BY RT PCR (FLU A&B, COVID)  BODY FLUID CULTURE  CULTURE, BLOOD (ROUTINE X 2)  CULTURE, BLOOD (ROUTINE X 2)  PROTIME-INR  APTT  GLUCOSE, PLEURAL OR PERITONEAL FLUID  PROTEIN, PLEURAL OR PERITONEAL FLUID  LACTATE DEHYDROGENASE, PLEURAL OR PERITONEAL FLUID  AMYLASE, PLEURAL OR PERITONEAL FLUID   BODY FLUID CELL COUNT WITH DIFFERENTIAL  CYTOLOGY - NON PAP  TROPONIN I (HIGH SENSITIVITY)  TROPONIN I (HIGH SENSITIVITY)   ____________________________________________   EKG  Interpreted by me Sinus tachycardia rate 101, normal axis and intervals.  Poor R wave progression.  Normal ST segments and T waves.  No acute ischemic  changes.  ____________________________________________    RADIOLOGY  DG Chest 2 View  Result Date: 10/21/2019 CLINICAL DATA:  85 year old male with shortness of breath. Patient is not able to raise his left arm. EXAM: CHEST - 2 VIEW COMPARISON:  Chest radiograph dated 10/02/2014. FINDINGS: There is a large left pleural effusion with compressive atelectasis of the majority of the left lung. A small aerated portion of the left upper lobe remains. The right lung is clear. No pneumothorax. The cardiac borders are silhouetted. Atherosclerotic calcification of the aorta. No acute  osseous pathology. IMPRESSION: Large left pleural effusion with compressive atelectasis of the majority of the left lung. Pneumonia is not excluded. Electronically Signed   By: Anner Crete M.D.   On: 10/21/2019 11:24   CT Chest Wo Contrast  Result Date: 10/21/2019 CLINICAL DATA:  Abnormal x-ray EXAM: CT CHEST WITHOUT CONTRAST TECHNIQUE: Multidetector CT imaging of the chest was performed following the standard protocol without IV contrast. COMPARISON:  None. FINDINGS: Cardiovascular: Normal heart size. Coronary artery calcification. No significant pericardial effusion. Calcified plaque along the thoracic aorta. Mediastinum/Nodes: Rightward mediastinal shift. No mediastinal or axillary adenopathy. Unremarkable thyroid. Lungs/Pleura: Large left pleural effusion with near complete atelectasis of the left lung. There is partial aeration of the left upper lobe. Right lung remains aerated with chronic interstitial changes at the lung base. Upper Abdomen: No acute abnormality. Musculoskeletal: No chest wall mass or significant osseous abnormality. IMPRESSION: Large left pleural effusion with compressive atelectasis of the majority of the left lung. Aortic Atherosclerosis (ICD10-I70.0). Electronically Signed   By: Macy Mis M.D.   On: 10/21/2019 12:35   DG Chest Portable 1 View  Result Date: 10/21/2019 CLINICAL DATA:   Left-sided chest tube placement. EXAM: PORTABLE CHEST 1 VIEW COMPARISON:  CT chest and chest x-ray from same day. FINDINGS: New left-sided chest tube at the lung base. Resolved left pleural effusion. Small left pneumothorax. Hazy density throughout the left lung. The right lung is clear. Normal heart size. No acute osseous abnormality. IMPRESSION: 1. Interval placement of a left-sided chest tube with resolved left pleural effusion. 2. Small left pneumothorax, presumably ex vacuo due to incomplete re-expansion of the left lung. 3. Hazy density throughout the left lung could reflect atelectasis and/or re-expansion pulmonary edema. Electronically Signed   By: Titus Dubin M.D.   On: 10/21/2019 14:11    ____________________________________________   PROCEDURES THORACENTESIS BEDSIDE  Date/Time: 10/21/2019 2:44 PM Performed by: Carrie Mew, MD Authorized by: Carrie Mew, MD   Consent:    Consent obtained:  Written   Consent given by:  Patient   Risks discussed:  Bleeding, infection, incomplete drainage, pain and pneumothorax   Alternatives discussed:  Delayed treatment Universal protocol:    Procedure explained and questions answered to patient or proxy's satisfaction: yes     Relevant documents present and verified: yes     Test results available and properly labeled: yes     Imaging studies available: yes     Required blood products, implants, devices and special equipment available: yes     Site/side marked: yes     Immediately prior to procedure a time out was called: yes     Patient identity confirmed:  Verbally with patient Anesthesia (see MAR for exact dosages):    Anesthesia method:  Local infiltration   Local anesthetic:  Lidocaine 1% WITH epi Procedure details:    Preparation: Patient was prepped and draped in usual sterile fashion     Patient position:  Supine   Location:  L midaxillary line   Intercostal space:  5th   Puncture method:  Over-the-needle  catheter   Ultrasound guidance: no     Indwelling catheter placed: yes     Needle gauge:  18   Catheter size: 24 french.   Number of attempts:  1   Drainage characteristics:  Serosanguinous Post-procedure details:    Chest x-ray performed: yes     Chest x-ray findings:  Pleural effusion improved   Patient tolerance of procedure:  Tolerated well, no immediate complications Comments:  ____________________________________________    CLINICAL IMPRESSION / ASSESSMENT AND PLAN / ED COURSE  Medications ordered in the ED: Medications  sodium chloride flush (NS) 0.9 % injection 3 mL (3 mLs Intravenous Not Given 10/21/19 1157)  albuterol (VENTOLIN HFA) 108 (90 Base) MCG/ACT inhaler 2 puff (has no administration in time range)  dextromethorphan-guaiFENesin (MUCINEX DM) 30-600 MG per 12 hr tablet 1 tablet (has no administration in time range)  sodium zirconium cyclosilicate (LOKELMA) packet 5 g (has no administration in time range)  hydrALAZINE (APRESOLINE) tablet 25 mg (has no administration in time range)  ondansetron (ZOFRAN) injection 4 mg (has no administration in time range)  acetaminophen (TYLENOL) tablet 650 mg (has no administration in time range)  oxyCODONE-acetaminophen (PERCOCET/ROXICET) 5-325 MG per tablet 1 tablet (has no administration in time range)  morphine 2 MG/ML injection 1 mg (has no administration in time range)  lidocaine-EPINEPHrine (XYLOCAINE W/EPI) 2 %-1:100000 (with pres) injection 30 mL (30 mLs Infiltration Given 10/21/19 1317)  HYDROmorphone (DILAUDID) injection 1 mg (1 mg Intravenous Given 10/21/19 1353)    Pertinent labs & imaging results that were available during my care of the patient were reviewed by me and considered in my medical decision making (see chart for details).  Denese Killings was evaluated in Emergency Department on 10/21/2019 for the symptoms described in the history of present illness. He was evaluated in the context of the  global COVID-19 pandemic, which necessitated consideration that the patient might be at risk for infection with the SARS-CoV-2 virus that causes COVID-19. Institutional protocols and algorithms that pertain to the evaluation of patients at risk for COVID-19 are in a state of rapid change based on information released by regulatory bodies including the CDC and federal and state organizations. These policies and algorithms were followed during the patient's care in the ED.   Patient presents with shortness of breath, on exam and initial chest x-ray found to have a very large pleural effusion occupying the left chest.  This is causing the significant symptoms so patient requests and consents for urgent chest tube placement in the ED.  He is not coagulopathic, he is hemodynamically stable for the procedure.  Covid PCR negative.  Patient denies any prior history of pleural effusions or lung disease.  No known lung cancer or recent pneumonia.  Clinical Course as of Oct 20 1449  Wed Oct 21, 2019  1347 Chest tube insertion completed - serosanguinous fluid.  will send samples to lab. At least 3 liters drained initially - will admit for chest tube care and monitoring during re-expansion.    [PS]    Clinical Course User Index [PS] Carrie Mew, MD       ____________________________________________   FINAL CLINICAL IMPRESSION(S) / ED DIAGNOSES    Final diagnoses:  Pleural effusion, left     ED Discharge Orders    None      Portions of this note were generated with dragon dictation software. Dictation errors may occur despite best attempts at proofreading.   Carrie Mew, MD 10/21/19 1450

## 2019-10-21 NOTE — ED Notes (Signed)
Daughter in law called to get update on pt, pt gave verbal consent to speak with her.

## 2019-10-22 ENCOUNTER — Inpatient Hospital Stay: Payer: Medicare Other

## 2019-10-22 DIAGNOSIS — M549 Dorsalgia, unspecified: Secondary | ICD-10-CM | POA: Diagnosis present

## 2019-10-22 DIAGNOSIS — N1831 Chronic kidney disease, stage 3a: Secondary | ICD-10-CM | POA: Diagnosis present

## 2019-10-22 DIAGNOSIS — K920 Hematemesis: Secondary | ICD-10-CM | POA: Diagnosis present

## 2019-10-22 DIAGNOSIS — N179 Acute kidney failure, unspecified: Secondary | ICD-10-CM | POA: Diagnosis present

## 2019-10-22 DIAGNOSIS — K828 Other specified diseases of gallbladder: Secondary | ICD-10-CM | POA: Diagnosis present

## 2019-10-22 DIAGNOSIS — M545 Low back pain: Secondary | ICD-10-CM | POA: Diagnosis not present

## 2019-10-22 DIAGNOSIS — E875 Hyperkalemia: Secondary | ICD-10-CM | POA: Diagnosis present

## 2019-10-22 DIAGNOSIS — D62 Acute posthemorrhagic anemia: Secondary | ICD-10-CM | POA: Diagnosis not present

## 2019-10-22 DIAGNOSIS — J939 Pneumothorax, unspecified: Secondary | ICD-10-CM | POA: Diagnosis present

## 2019-10-22 DIAGNOSIS — G8929 Other chronic pain: Secondary | ICD-10-CM | POA: Diagnosis present

## 2019-10-22 DIAGNOSIS — Z20822 Contact with and (suspected) exposure to covid-19: Secondary | ICD-10-CM | POA: Diagnosis present

## 2019-10-22 DIAGNOSIS — J910 Malignant pleural effusion: Secondary | ICD-10-CM | POA: Diagnosis present

## 2019-10-22 DIAGNOSIS — I129 Hypertensive chronic kidney disease with stage 1 through stage 4 chronic kidney disease, or unspecified chronic kidney disease: Secondary | ICD-10-CM | POA: Diagnosis present

## 2019-10-22 DIAGNOSIS — K810 Acute cholecystitis: Secondary | ICD-10-CM | POA: Diagnosis not present

## 2019-10-22 DIAGNOSIS — I10 Essential (primary) hypertension: Secondary | ICD-10-CM | POA: Diagnosis not present

## 2019-10-22 DIAGNOSIS — F05 Delirium due to known physiological condition: Secondary | ICD-10-CM | POA: Diagnosis not present

## 2019-10-22 DIAGNOSIS — Z7189 Other specified counseling: Secondary | ICD-10-CM | POA: Diagnosis not present

## 2019-10-22 DIAGNOSIS — J90 Pleural effusion, not elsewhere classified: Secondary | ICD-10-CM | POA: Diagnosis not present

## 2019-10-22 DIAGNOSIS — J9382 Other air leak: Secondary | ICD-10-CM | POA: Diagnosis not present

## 2019-10-22 DIAGNOSIS — J9811 Atelectasis: Secondary | ICD-10-CM | POA: Diagnosis present

## 2019-10-22 DIAGNOSIS — Z515 Encounter for palliative care: Secondary | ICD-10-CM | POA: Diagnosis not present

## 2019-10-22 DIAGNOSIS — E1122 Type 2 diabetes mellitus with diabetic chronic kidney disease: Secondary | ICD-10-CM | POA: Diagnosis present

## 2019-10-22 DIAGNOSIS — K5909 Other constipation: Secondary | ICD-10-CM | POA: Diagnosis present

## 2019-10-22 DIAGNOSIS — R0602 Shortness of breath: Secondary | ICD-10-CM | POA: Diagnosis present

## 2019-10-22 DIAGNOSIS — Z66 Do not resuscitate: Secondary | ICD-10-CM | POA: Diagnosis present

## 2019-10-22 DIAGNOSIS — M0519 Rheumatoid lung disease with rheumatoid arthritis of multiple sites: Secondary | ICD-10-CM | POA: Diagnosis present

## 2019-10-22 DIAGNOSIS — G629 Polyneuropathy, unspecified: Secondary | ICD-10-CM | POA: Diagnosis present

## 2019-10-22 DIAGNOSIS — J984 Other disorders of lung: Secondary | ICD-10-CM | POA: Diagnosis not present

## 2019-10-22 DIAGNOSIS — D473 Essential (hemorrhagic) thrombocythemia: Secondary | ICD-10-CM | POA: Diagnosis not present

## 2019-10-22 DIAGNOSIS — E440 Moderate protein-calorie malnutrition: Secondary | ICD-10-CM | POA: Diagnosis present

## 2019-10-22 DIAGNOSIS — K567 Ileus, unspecified: Secondary | ICD-10-CM | POA: Diagnosis present

## 2019-10-22 LAB — BASIC METABOLIC PANEL
Anion gap: 13 (ref 5–15)
BUN: 34 mg/dL — ABNORMAL HIGH (ref 8–23)
CO2: 25 mmol/L (ref 22–32)
Calcium: 8.2 mg/dL — ABNORMAL LOW (ref 8.9–10.3)
Chloride: 99 mmol/L (ref 98–111)
Creatinine, Ser: 1.45 mg/dL — ABNORMAL HIGH (ref 0.61–1.24)
GFR calc Af Amer: 50 mL/min — ABNORMAL LOW (ref >60)
GFR calc non Af Amer: 43 mL/min — ABNORMAL LOW (ref >60)
Glucose, Bld: 168 mg/dL — ABNORMAL HIGH (ref 70–99)
Potassium: 5.2 mmol/L — ABNORMAL HIGH (ref 3.5–5.1)
Sodium: 137 mmol/L (ref 135–145)

## 2019-10-22 LAB — HEMOGLOBIN A1C
Hgb A1c MFr Bld: 6.9 % — ABNORMAL HIGH (ref 4.8–5.6)
Mean Plasma Glucose: 151.33 mg/dL

## 2019-10-22 LAB — COMPREHENSIVE METABOLIC PANEL
ALT: 27 U/L (ref 0–44)
AST: 16 U/L (ref 15–41)
Albumin: 2.7 g/dL — ABNORMAL LOW (ref 3.5–5.0)
Alkaline Phosphatase: 59 U/L (ref 38–126)
Anion gap: 11 (ref 5–15)
BUN: 41 mg/dL — ABNORMAL HIGH (ref 8–23)
CO2: 27 mmol/L (ref 22–32)
Calcium: 8 mg/dL — ABNORMAL LOW (ref 8.9–10.3)
Chloride: 95 mmol/L — ABNORMAL LOW (ref 98–111)
Creatinine, Ser: 1.53 mg/dL — ABNORMAL HIGH (ref 0.61–1.24)
GFR calc Af Amer: 47 mL/min — ABNORMAL LOW (ref >60)
GFR calc non Af Amer: 41 mL/min — ABNORMAL LOW (ref >60)
Glucose, Bld: 223 mg/dL — ABNORMAL HIGH (ref 70–99)
Potassium: 5.2 mmol/L — ABNORMAL HIGH (ref 3.5–5.1)
Sodium: 133 mmol/L — ABNORMAL LOW (ref 135–145)
Total Bilirubin: 0.7 mg/dL (ref 0.3–1.2)
Total Protein: 6.2 g/dL — ABNORMAL LOW (ref 6.5–8.1)

## 2019-10-22 LAB — CBC WITH DIFFERENTIAL/PLATELET
Abs Immature Granulocytes: 0.11 10*3/uL — ABNORMAL HIGH (ref 0.00–0.07)
Basophils Absolute: 0 10*3/uL (ref 0.0–0.1)
Basophils Relative: 0 %
Eosinophils Absolute: 0 10*3/uL (ref 0.0–0.5)
Eosinophils Relative: 0 %
HCT: 32.6 % — ABNORMAL LOW (ref 39.0–52.0)
Hemoglobin: 10.3 g/dL — ABNORMAL LOW (ref 13.0–17.0)
Immature Granulocytes: 1 %
Lymphocytes Relative: 1 %
Lymphs Abs: 0.1 10*3/uL — ABNORMAL LOW (ref 0.7–4.0)
MCH: 29.3 pg (ref 26.0–34.0)
MCHC: 31.6 g/dL (ref 30.0–36.0)
MCV: 92.9 fL (ref 80.0–100.0)
Monocytes Absolute: 0.7 10*3/uL (ref 0.1–1.0)
Monocytes Relative: 5 %
Neutro Abs: 14.5 10*3/uL — ABNORMAL HIGH (ref 1.7–7.7)
Neutrophils Relative %: 93 %
Platelets: 571 10*3/uL — ABNORMAL HIGH (ref 150–400)
RBC: 3.51 MIL/uL — ABNORMAL LOW (ref 4.22–5.81)
RDW: 12.3 % (ref 11.5–15.5)
WBC: 15.5 10*3/uL — ABNORMAL HIGH (ref 4.0–10.5)
nRBC: 0 % (ref 0.0–0.2)

## 2019-10-22 LAB — CBC
HCT: 33.7 % — ABNORMAL LOW (ref 39.0–52.0)
Hemoglobin: 10.8 g/dL — ABNORMAL LOW (ref 13.0–17.0)
MCH: 29.7 pg (ref 26.0–34.0)
MCHC: 32 g/dL (ref 30.0–36.0)
MCV: 92.6 fL (ref 80.0–100.0)
Platelets: 600 10*3/uL — ABNORMAL HIGH (ref 150–400)
RBC: 3.64 MIL/uL — ABNORMAL LOW (ref 4.22–5.81)
RDW: 12.4 % (ref 11.5–15.5)
WBC: 15.9 10*3/uL — ABNORMAL HIGH (ref 4.0–10.5)
nRBC: 0 % (ref 0.0–0.2)

## 2019-10-22 LAB — PH, BODY FLUID: pH, Body Fluid: 7.8

## 2019-10-22 LAB — PROTIME-INR
INR: 1.2 (ref 0.8–1.2)
Prothrombin Time: 15.3 seconds — ABNORMAL HIGH (ref 11.4–15.2)

## 2019-10-22 LAB — GLUCOSE, CAPILLARY
Glucose-Capillary: 139 mg/dL — ABNORMAL HIGH (ref 70–99)
Glucose-Capillary: 210 mg/dL — ABNORMAL HIGH (ref 70–99)
Glucose-Capillary: 179 mg/dL — ABNORMAL HIGH (ref 70–99)
Glucose-Capillary: 204 mg/dL — ABNORMAL HIGH (ref 70–99)
Glucose-Capillary: 181 mg/dL — ABNORMAL HIGH (ref 70–99)
Glucose-Capillary: 189 mg/dL — ABNORMAL HIGH (ref 70–99)

## 2019-10-22 LAB — APTT: aPTT: 33 seconds (ref 24–36)

## 2019-10-22 LAB — LIPASE, BLOOD: Lipase: 77 U/L — ABNORMAL HIGH (ref 11–51)

## 2019-10-22 LAB — POTASSIUM: Potassium: 5.1 mmol/L (ref 3.5–5.1)

## 2019-10-22 MED ORDER — MORPHINE SULFATE (PF) 4 MG/ML IV SOLN
4.0000 mg | Freq: Once | INTRAVENOUS | Status: AC
  Administered 2019-10-22: 4 mg via INTRAVENOUS

## 2019-10-22 MED ORDER — DOCUSATE SODIUM 50 MG/5ML PO LIQD
200.0000 mg | Freq: Two times a day (BID) | ORAL | Status: DC
  Administered 2019-10-22 – 2019-10-27 (×5): 200 mg via ORAL
  Filled 2019-10-22 (×15): qty 20

## 2019-10-22 MED ORDER — SCOPOLAMINE 1 MG/3DAYS TD PT72
1.0000 | MEDICATED_PATCH | TRANSDERMAL | Status: DC
  Administered 2019-10-22 – 2019-10-31 (×4): 1.5 mg via TRANSDERMAL
  Filled 2019-10-22 (×5): qty 1

## 2019-10-22 MED ORDER — BISACODYL 5 MG PO TBEC
5.0000 mg | DELAYED_RELEASE_TABLET | Freq: Every day | ORAL | Status: DC | PRN
  Administered 2019-10-29 – 2019-11-02 (×4): 5 mg via ORAL
  Filled 2019-10-22 (×6): qty 1

## 2019-10-22 MED ORDER — CHLORHEXIDINE GLUCONATE CLOTH 2 % EX PADS
6.0000 | MEDICATED_PAD | Freq: Every day | CUTANEOUS | Status: DC
  Administered 2019-10-22 – 2019-10-24 (×3): 6 via TOPICAL

## 2019-10-22 MED ORDER — LIDOCAINE HCL URETHRAL/MUCOSAL 2 % EX GEL
1.0000 "application " | Freq: Once | CUTANEOUS | Status: AC
  Administered 2019-10-22: 1 via TOPICAL

## 2019-10-22 MED ORDER — ONDANSETRON HCL 4 MG/2ML IJ SOLN
4.0000 mg | Freq: Once | INTRAMUSCULAR | Status: AC
  Administered 2019-10-22: 4 mg via INTRAVENOUS

## 2019-10-22 MED ORDER — HYDRALAZINE HCL 20 MG/ML IJ SOLN
20.0000 mg | Freq: Four times a day (QID) | INTRAMUSCULAR | Status: DC | PRN
  Filled 2019-10-22: qty 1

## 2019-10-22 MED ORDER — SODIUM CHLORIDE 0.9 % IV BOLUS
500.0000 mL | Freq: Once | INTRAVENOUS | Status: AC
  Administered 2019-10-22: 500 mL via INTRAVENOUS

## 2019-10-22 MED ORDER — PANTOPRAZOLE SODIUM 40 MG IV SOLR
40.0000 mg | INTRAVENOUS | Status: DC
  Administered 2019-10-23 – 2019-11-02 (×11): 40 mg via INTRAVENOUS
  Filled 2019-10-22 (×11): qty 40

## 2019-10-22 MED ORDER — MORPHINE SULFATE (PF) 4 MG/ML IV SOLN
INTRAVENOUS | Status: AC
  Filled 2019-10-22: qty 1

## 2019-10-22 MED ORDER — PANTOPRAZOLE SODIUM 40 MG IV SOLR
40.0000 mg | Freq: Once | INTRAVENOUS | Status: AC
  Administered 2019-10-22: 40 mg via INTRAVENOUS
  Filled 2019-10-22: qty 40

## 2019-10-22 NOTE — Progress Notes (Signed)
Patient's daughter in-law Quaid Gochnauer) called with multiple concerns. RN was able to reassure family that patient had reported no pain this shift, he had a pillow, and he was alert and oriented, able to make decisions. Family adamant about visitation in the ER. RN educated family that visitation is limited in the ER at this time for safety and infection control precautions. Encouraged family to continue calls to patient and RN for updates as needed.

## 2019-10-22 NOTE — ED Notes (Signed)
Report given to incoming ICU RN. Pt informed of transfer.

## 2019-10-22 NOTE — Progress Notes (Signed)
Sharion Settler, NP notified of Patient's K of 5.2 this am.

## 2019-10-22 NOTE — Progress Notes (Signed)
Patient repositioned in bed. Pain medication administered. Patient reports retiring 31 years ago today. 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 He reports having help at this time to care for his wife but is concerned about his discharge and health at that time. Reassurance provided.

## 2019-10-22 NOTE — Progress Notes (Signed)
Sharion Settler, NP notified of pt's pain level and air leak in chest tube system. Orders for -20cm suction and increase of pain meds.

## 2019-10-22 NOTE — ED Notes (Signed)
Dr. Lenise Herald contacted regarding the abnormal assessment of abdomen.

## 2019-10-22 NOTE — Progress Notes (Signed)
PROGRESS NOTE    David Cisneros  [id number] DOB: 1931/08/24 DOA: 10/21/2019 PCP: Valera Castle, MD       Assessment & Plan:   Principal Problem:   Pleural effusion on left Active Problems:   Hyperkalemia   Hypertension   Chronic back pain   CKD (chronic kidney disease), stage IIIa   Leukocytosis   Pneumothorax on left   Rheumatoid arthritis (HCC)   Left pleural effusion: Etiology is not clear. S/p of left chest tube placement. Per EDP, approximately 3 L of bloody fluid were removed. Pleural fluid analysis pending. Morphine, oxycodone prn for pain  Pneumothorax on left: small. S/p left chest tube. Pulmon consulted  Hyperkalemia: s/p lokelma.  Repeat K is WNL but upper limit of normal. Will continue to monitor   AKI on CKDIIIa: baseline creatinine 1.33. Cr is trending up today. If Cr continues to trend up will consult nephro  Thrombocytosis: likely reactive. Will continue to monitor   Normocytic anemia: no need for a transfusion at this time. Will continue to monitor   Hypertension: not taking medication for blood pressure at home. Hydralazine prn. May start oral med if blood pressure elevation is a persistent  Chronic back pain: percocet & morphine prn for pain   Leukocytosis: likely secondary to steroid use. Will continue to monitor   Rheumatoid arthritis: at home on Xeljanz and prednisone & will continue these meds but an increased dose of prednisone from 10 to 20mg  daily      DVT prophylaxis: SCDs secondary to bloody pleural fluid output Code Status: full Family Communication:  Disposition Plan:    Consultants:   Pulmon   Procedures:  S/p chest tube placement on 10/21/19   Antimicrobials:    Subjective: Pt c/o fatigue   Objective: Vitals:   10/22/19 0200 10/22/19 0425 10/22/19 0802 10/22/19 1219  BP: 121/70 (!) 164/82 (!) 142/73   Pulse: 85 100 86   Resp: 14 18    Temp:  98.5 F (36.9 C) 98.5 F (36.9 C)   TempSrc:  Oral  Oral   SpO2: 99% 100% 100% 98%  Weight:      Height:        Intake/Output Summary (Last 24 hours) at 10/22/2019 1425 Last data filed at 10/22/2019 0838 Gross per 24 hour  Intake --  Output 5210 ml  Net -5210 ml   Filed Weights   10/21/19 1050  Weight: 72.6 kg    Examination:  General exam: Appears calm and comfortable  Respiratory system: diminished breath sounds b/l. Chest tube in place draining bloody fluid Cardiovascular system: S1 & S2 +. No rubs, gallops or clicks. No pedal edema. Gastrointestinal system: Abdomen is nondistended, soft and nontender.  Normal bowel sounds heard. Central nervous system: Alert and oriented. No focal neurological deficits. Psychiatry: Judgement and insight appear normal. Mood & affect appropriate.     Data Reviewed: I have personally reviewed following labs and imaging studies  CBC: Recent Labs  Lab 10/21/19 1054 10/22/19 0436  WBC 13.8* 15.9*  HGB 10.4* 10.8*  HCT 32.9* 33.7*  MCV 93.7 92.6  PLT 613* 0000000*   Basic Metabolic Panel: Recent Labs  Lab 10/21/19 1054 10/22/19 0436 10/22/19 1035  NA 136 137  --   K 5.4* 5.2* 5.1  CL 96* 99  --   CO2 25 25  --   GLUCOSE 195* 168*  --   BUN 26* 34*  --   CREATININE 1.44* 1.45*  --   CALCIUM 8.7* 8.2*  --  GFR: Estimated Creatinine Clearance: 31.8 mL/min (A) (by C-G formula based on SCr of 1.45 mg/dL (H)). Liver Function Tests: No results for input(s): AST, ALT, ALKPHOS, BILITOT, PROT, ALBUMIN in the last 168 hours. No results for input(s): LIPASE, AMYLASE in the last 168 hours. No results for input(s): AMMONIA in the last 168 hours. Coagulation Profile: Recent Labs  Lab 10/21/19 1353  INR 1.1   Cardiac Enzymes: No results for input(s): CKTOTAL, CKMB, CKMBINDEX, TROPONINI in the last 168 hours. BNP (last 3 results) No results for input(s): PROBNP in the last 8760 hours. HbA1C: Recent Labs    10/21/19 2205  HGBA1C 6.9*   CBG: Recent Labs  Lab 10/21/19 2151  10/22/19 0801 10/22/19 1152  GLUCAP 165* 139* 210*   Lipid Profile: No results for input(s): CHOL, HDL, LDLCALC, TRIG, CHOLHDL, LDLDIRECT in the last 72 hours. Thyroid Function Tests: No results for input(s): TSH, T4TOTAL, FREET4, T3FREE, THYROIDAB in the last 72 hours. Anemia Panel: No results for input(s): VITAMINB12, FOLATE, FERRITIN, TIBC, IRON, RETICCTPCT in the last 72 hours. Sepsis Labs: No results for input(s): PROCALCITON, LATICACIDVEN in the last 168 hours.  Recent Results (from the past 240 hour(s))  Respiratory Panel by RT PCR (Flu A&B, Covid) - Nasopharyngeal Swab     Status: None   Collection Time: 10/21/19 12:33 PM   Specimen: Nasopharyngeal Swab  Result Value Ref Range Status   SARS Coronavirus 2 by RT PCR NEGATIVE NEGATIVE Final    Comment: (NOTE) SARS-CoV-2 target nucleic acids are NOT DETECTED. The SARS-CoV-2 RNA is generally detectable in upper respiratoy specimens during the acute phase of infection. The lowest concentration of SARS-CoV-2 viral copies this assay can detect is 131 copies/mL. A negative result does not preclude SARS-Cov-2 infection and should not be used as the sole basis for treatment or other patient management decisions. A negative result may occur with  improper specimen collection/handling, submission of specimen other than nasopharyngeal swab, presence of viral mutation(s) within the areas targeted by this assay, and inadequate number of viral copies (<131 copies/mL). A negative result must be combined with clinical observations, patient history, and epidemiological information. The expected result is Negative. Fact Sheet for Patients:  PinkCheek.be Fact Sheet for Healthcare Providers:  GravelBags.it This test is not yet ap proved or cleared by the [place] FDA and  has been authorized for detection and/or diagnosis of SARS-CoV-2 by FDA under an Emergency Use Authorization  (EUA). This EUA will remain  in effect (meaning this test can be used) for the duration of the COVID-19 declaration under Section 564(b)(1) of the Act, 21 U.S.C. section 360bbb-3(b)(1), unless the authorization is terminated or revoked sooner.    Influenza A by PCR NEGATIVE NEGATIVE Final   Influenza B by PCR NEGATIVE NEGATIVE Final    Comment: (NOTE) The Xpert Xpress SARS-CoV-2/FLU/RSV assay is intended as an aid in  the diagnosis of influenza from Nasopharyngeal swab specimens and  should not be used as a sole basis for treatment. Nasal washings and  aspirates are unacceptable for Xpert Xpress SARS-CoV-2/FLU/RSV  testing. Fact Sheet for Patients: PinkCheek.be Fact Sheet for Healthcare Providers: GravelBags.it This test is not yet approved or cleared by the [place] FDA and  has been authorized for detection and/or diagnosis of SARS-CoV-2 by  FDA under an Emergency Use Authorization (EUA). This EUA will remain  in effect (meaning this test can be used) for the duration of the  Covid-19 declaration under Section 564(b)(1) of the Act, 21  U.S.C. section 360bbb-3(b)(1),  unless the authorization is  terminated or revoked. Performed at [facility], [address] [place], [place] [address]   CULTURE, BLOOD (ROUTINE X 2) w Reflex to ID Panel     Status: None (Preliminary result)   Collection Time: 10/21/19 10:04 PM   Specimen: BLOOD  Result Value Ref Range Status   Specimen Description BLOOD RIGHT ANTECUBITAL  Final   Special Requests   Final    BOTTLES DRAWN AEROBIC AND ANAEROBIC Blood Culture results may not be optimal due to an excessive volume of blood received in culture bottles   Culture   Final    NO GROWTH < 12 HOURS Performed at [facility], [address] [place], [place] [address]    Report Status PENDING  Incomplete  CULTURE, BLOOD (ROUTINE X 2) w Reflex to ID Panel     Status: None  (Preliminary result)   Collection Time: 10/21/19 10:04 PM   Specimen: BLOOD  Result Value Ref Range Status   Specimen Description BLOOD RIGHT ANTECUBITAL  Final   Special Requests   Final    BOTTLES DRAWN AEROBIC AND ANAEROBIC Blood Culture results may not be optimal due to an excessive volume of blood received in culture bottles   Culture   Final    NO GROWTH < 12 HOURS Performed at [facility], [address] [place], [place] [address]    Report Status PENDING  Incomplete         Radiology Studies: DG Chest 2 View  Result Date: 10/21/2019 CLINICAL DATA:  85 year old male with shortness of breath. Patient is not able to raise his left arm. EXAM: CHEST - 2 VIEW COMPARISON:  Chest radiograph dated 10/02/2014. FINDINGS: There is a large left pleural effusion with compressive atelectasis of the majority of the left lung. A small aerated portion of the left upper lobe remains. The right lung is clear. No pneumothorax. The cardiac borders are silhouetted. Atherosclerotic calcification of the aorta. No acute osseous pathology. IMPRESSION: Large left pleural effusion with compressive atelectasis of the majority of the left lung. Pneumonia is not excluded. Electronically Signed   By: Anner Crete M.D.   On: 10/21/2019 11:24   CT Chest Wo Contrast  Result Date: 10/21/2019 CLINICAL DATA:  Abnormal x-ray EXAM: CT CHEST WITHOUT CONTRAST TECHNIQUE: Multidetector CT imaging of the chest was performed following the standard protocol without IV contrast. COMPARISON:  None. FINDINGS: Cardiovascular: Normal heart size. Coronary artery calcification. No significant pericardial effusion. Calcified plaque along the thoracic aorta. Mediastinum/Nodes: Rightward mediastinal shift. No mediastinal or axillary adenopathy. Unremarkable thyroid. Lungs/Pleura: Large left pleural effusion with near complete atelectasis of the left lung. There is partial aeration of the left upper lobe. Right lung remains  aerated with chronic interstitial changes at the lung base. Upper Abdomen: No acute abnormality. Musculoskeletal: No chest wall mass or significant osseous abnormality. IMPRESSION: Large left pleural effusion with compressive atelectasis of the majority of the left lung. Aortic Atherosclerosis (ICD10-I70.0). Electronically Signed   By: Macy Mis M.D.   On: 10/21/2019 12:35   DG Chest Portable 1 View  Result Date: 10/21/2019 CLINICAL DATA:  Left-sided chest tube placement. EXAM: PORTABLE CHEST 1 VIEW COMPARISON:  CT chest and chest x-ray from same day. FINDINGS: New left-sided chest tube at the lung base. Resolved left pleural effusion. Small left pneumothorax. Hazy density throughout the left lung. The right lung is clear. Normal heart size. No acute osseous abnormality. IMPRESSION: 1. Interval placement of a left-sided chest tube  with resolved left pleural effusion. 2. Small left pneumothorax, presumably ex vacuo due to incomplete re-expansion of the left lung. 3. Hazy density throughout the left lung could reflect atelectasis and/or re-expansion pulmonary edema. Electronically Signed   By: Titus Dubin M.D.   On: 10/21/2019 14:11        Scheduled Meds:  dextromethorphan-guaiFENesin  1 tablet Oral BID   docusate  200 mg Oral BID   finasteride  5 mg Oral Daily   insulin aspart  0-5 Units Subcutaneous QHS   insulin aspart  0-9 Units Subcutaneous TID WC   linaclotide  145 mcg Oral q morning - 10a   predniSONE  20 mg Oral Daily   sodium chloride flush  3 mL Intravenous Once   Tofacitinib Citrate  5 mg Oral BID   Continuous Infusions:   LOS: 0 days    Time spent: 33 mins    Wyvonnia Dusky, MD [facility] Pager [phone]  If 7PM-7AM, please contact night-coverage [email or web address] Password [email or web address] 10/22/2019, 2:25 PM

## 2019-10-22 NOTE — Progress Notes (Signed)
Secure chat sent to Dr. Lenise Herald. Due to urgency MD was subsequently paged given severity of patient's condition and his sudden decline per RN discretion. MD states she would prefer only one attempt to notify. Orders were placed for abdominal CT.

## 2019-10-22 NOTE — ED Notes (Signed)
ED TO INPATIENT HANDOFF REPORT  ED Nurse Name and Phone #: Mylo Red Name/Age/Gender David Cisneros 85 y.o. male Room/Bed: ED08A/ED08A  Code Status   Code Status: Full Code  Home/SNF/Other Home Patient oriented to: self, place and situation Is this baseline? unsure  Triage Complete: Triage complete  Chief Complaint Pleural effusion on left [J90]  Triage Note Pt sent from MD office, states he was there today for injections in his back for chronic pain states he was sent here due to SOB for the past 2 weeks and HTN, tachycardia. Pt is in NAD> pt c/o chronic pain in back and neck at present.    Allergies Allergies  Allergen Reactions  . Sulfa Antibiotics Rash    Level of Care/Admitting Diagnosis ED Disposition    ED Disposition Condition [facility]: [place] [[id number]]  Level of Care: Stepdown [14]  Covid Evaluation: Asymptomatic Screening Protocol (No Symptoms)  Diagnosis: Pleural effusion on left [id number]  Admitting Physician: Wyvonnia Dusky [id number]  Attending Physician: Wyvonnia Dusky [id number]  Estimated length of stay: past midnight tomorrow  Certification:: I certify this patient will need inpatient services for at least 2 midnights       B Medical/Surgery History Past Medical History:  Diagnosis Date  . Cancer ([place])    skin   . Chronic back pain   . Collagen vascular disease ([facility])    RA  . Diabetes mellitus without complication ([place])   . Hypertension    History reviewed. No pertinent surgical history.   A IV Location/Drains/Wounds Patient Lines/Drains/Airways Status   Active Line/Drains/Airways    Name:   Placement date:   Placement time:   Site:   Days:   Peripheral IV 10/22/19 Left Antecubital   10/22/19    1836    Antecubital   less than 1   Chest Tube 1 Left;Lateral Pleural   10/21/19    1355    Pleural   1   NG/OG Tube Nasogastric 18 Fr. Right nare Aucultation Documented cm marking  at nare/ corner of mouth 58 cm   10/22/19    1848    Right nare   less than 1          Intake/Output Last 24 hours  Intake/Output Summary (Last 24 hours) at 10/22/2019 2153 Last data filed at 10/22/2019 1848 Gross per 24 hour  Intake --  Output 9510 ml  Net -9510 ml    Labs/Imaging Results for orders placed or performed during the hospital encounter of 10/21/19 (from the past 48 hour(s))  Basic metabolic panel     Status: Abnormal   Collection Time: 10/21/19 10:54 AM  Result Value Ref Range   Sodium 136 135 - 145 mmol/L   Potassium 5.4 (H) 3.5 - 5.1 mmol/L   Chloride 96 (L) 98 - 111 mmol/L   CO2 25 22 - 32 mmol/L   Glucose, Bld 195 (H) 70 - 99 mg/dL   BUN 26 (H) 8 - 23 mg/dL   Creatinine, Ser 1.44 (H) 0.61 - 1.24 mg/dL   Calcium 8.7 (L) 8.9 - 10.3 mg/dL   GFR calc non Af Amer 44 (L) >60 mL/min   GFR calc Af Amer 51 (L) >60 mL/min   Anion gap 15 5 - 15    Comment: Performed at [facility], [address], [place], [place] [address]  CBC     Status: Abnormal   Collection Time: 10/21/19 10:54 AM  Result Value Ref Range   WBC 13.8 (H) 4.0 - 10.5 K/uL   RBC 3.51 (L) 4.22 - 5.81 MIL/uL   Hemoglobin 10.4 (L) 13.0 - 17.0 g/dL   HCT 32.9 (L) 39.0 - 52.0 %   MCV 93.7 80.0 - 100.0 fL   MCH 29.6 26.0 - 34.0 pg   MCHC 31.6 30.0 - 36.0 g/dL   RDW 12.4 11.5 - 15.5 %   Platelets 613 (H) 150 - 400 K/uL   nRBC 0.0 0.0 - 0.2 %    Comment: Performed at [facility], [place], [place] [address]  Troponin I (High Sensitivity)     Status: None   Collection Time: 10/21/19 10:54 AM  Result Value Ref Range   Troponin I (High Sensitivity) 8 <18 ng/L    Comment: (NOTE) Elevated high sensitivity troponin I (hsTnI) values and significant  changes across serial measurements may suggest ACS but many other  chronic and acute conditions are known to elevate hsTnI results.  Refer to the "Links" section for chest pain algorithms and additional   guidance. Performed at [facility], [address], [place],  [address]   Respiratory Panel by RT PCR (Flu A&B, Covid) - Nasopharyngeal Swab     Status: None   Collection Time: 10/21/19 12:33 PM   Specimen: Nasopharyngeal Swab  Result Value Ref Range   SARS Coronavirus 2 by RT PCR NEGATIVE NEGATIVE    Comment: (NOTE) SARS-CoV-2 target nucleic acids are NOT DETECTED. The SARS-CoV-2 RNA is generally detectable in upper respiratoy specimens during the acute phase of infection. The lowest concentration of SARS-CoV-2 viral copies this assay can detect is 131 copies/mL. A negative result does not preclude SARS-Cov-2 infection and should not be used as the sole basis for treatment or other patient management decisions. A negative result may occur with  improper specimen collection/handling, submission of specimen other than nasopharyngeal swab, presence of viral mutation(s) within the areas targeted by this assay, and inadequate number of viral copies (<131 copies/mL). A negative result must be combined with clinical observations, patient history, and epidemiological information. The expected result is Negative. Fact Sheet for Patients:  PinkCheek.be Fact Sheet for Healthcare Providers:  GravelBags.it This test is not yet ap proved or cleared by the [place] FDA and  has been authorized for detection and/or diagnosis of SARS-CoV-2 by FDA under an Emergency Use Authorization (EUA). This EUA will remain  in effect (meaning this test can be used) for the duration of the COVID-19 declaration under Section 564(b)(1) of the Act, 21 U.S.C. section 360bbb-3(b)(1), unless the authorization is terminated or revoked sooner.    Influenza A by PCR NEGATIVE NEGATIVE   Influenza B by PCR NEGATIVE NEGATIVE    Comment: (NOTE) The Xpert Xpress SARS-CoV-2/FLU/RSV assay is intended as an aid in  the diagnosis of influenza  from Nasopharyngeal swab specimens and  should not be used as a sole basis for treatment. Nasal washings and  aspirates are unacceptable for Xpert Xpress SARS-CoV-2/FLU/RSV  testing. Fact Sheet for Patients: PinkCheek.be Fact Sheet for Healthcare Providers: GravelBags.it This test is not yet approved or cleared by the [place] FDA and  has been authorized for detection and/or diagnosis of SARS-CoV-2 by  FDA under an Emergency Use Authorization (EUA). This EUA will remain  in effect (meaning this test can be used) for the duration of the  Covid-19 declaration under Section 564(b)(1) of the Act, 21  U.S.C. section 360bbb-3(b)(1), unless the authorization is  terminated or revoked. Performed at [facility], [place], [place] [address]   Troponin I (High Sensitivity)     Status: None   Collection Time: 10/21/19  1:53 PM  Result Value Ref Range   Troponin I (High Sensitivity) 7 <18 ng/L    Comment: (NOTE) Elevated high sensitivity troponin I (hsTnI) values and significant  changes across serial measurements may suggest ACS but many other  chronic and acute conditions are known to elevate hsTnI results.  Refer to the "Links" section for chest pain algorithms and additional  guidance. Performed at [facility], [address] [place], [place] [address]   Glucose, pleural or peritoneal fluid     Status: None   Collection Time: 10/21/19  1:53 PM  Result Value Ref Range   Glucose, Fluid 108 mg/dL    Comment: (NOTE) No normal range established for this test Results should be evaluated in conjunction with serum values    Fluid Type-FGLU PLEURAL     Comment: Performed at [facility], [address], [place], [place] [address]  Protein, pleural or peritoneal fluid     Status: None   Collection Time: 10/21/19  1:53 PM  Result Value Ref Range   Total protein, fluid 4.4 g/dL     Comment: (NOTE) No normal range established for this test Results should be evaluated in conjunction with serum values    Fluid Type-FTP PLEURAL     Comment: Performed at [facility], [address], [place], [place] [address]  Lactate dehydrogenase (pleural or peritoneal fluid)     Status: Abnormal   Collection Time: 10/21/19  1:53 PM  Result Value Ref Range   LD, Fluid 402 (H) 3 - 23 U/L    Comment: (NOTE) Results should be evaluated in conjunction with serum values    Fluid Type-FLDH PLEURAL     Comment: Performed at [facility], [address] [place], [place] [address]  Amylase, pleural or peritoneal fluid     Status: None   Collection Time: 10/21/19  1:53 PM  Result Value Ref Range   Amylase, Fluid 43 U/L    Comment: NO NORMAL RANGE ESTABLISHED FOR THIS TEST   Fluid Type-FAMY PLEURAL     Comment: Performed at [facility], [address] [place], [place] [address]  Body fluid cell count with differential     Status: Abnormal   Collection Time: 10/21/19  1:53 PM  Result Value Ref Range   Fluid Type-FCT PLEURAL    Color, Fluid RED (A) YELLOW   Appearance, Fluid CLOUDY (A) CLEAR   Total Nucleated Cell Count, Fluid 995 cu mm   Neutrophil Count, Fluid 38 %   Lymphs, Fluid 51 %   Monocyte-Macrophage-Serous Fluid 10 %   Eos, Fluid 1 %    Comment: Performed at [facility], [address] [place], [place] [address]  Protime-INR     Status: None   Collection Time: 10/21/19  1:53 PM  Result Value Ref Range   Prothrombin Time 14.5 11.4 - 15.2 seconds   INR 1.1 0.8 - 1.2    Comment: (NOTE) INR goal varies based on device and disease states. Performed at [facility], [address] [place],  [address]   APTT     Status: None   Collection Time: 10/21/19  1:53 PM  Result Value Ref Range   aPTT 32 24 - 36 seconds    Comment: Performed at [facility],  [place], [place] [address]  PH,  Body Fluid     Status: None   Collection Time: 10/21/19  3:16 PM  Result Value Ref Range   pH, Body Fluid 7.8 Not Estab.    Comment: (NOTE) This test was developed and its performance characteristics determined by [facility]. It has not been cleared or approved by the Food and Drug Administration. The reference interval(s) and other method performance specifications have not been established for this body fluid. The test result must be integrated into the clinical context for interpretation. Performed At: [facility] [place], [place] [id number] Rush Farmer MD [id number]    Source of Sample PLEURAL     Comment: Performed at [facility], [address] [place], [place] [address]  Today - LDH     Status: None   Collection Time: 10/21/19  3:43 PM  Result Value Ref Range   LDH 128 98 - 192 U/L    Comment: Performed at [facility], [address] [place], [place] [address]  Glucose, capillary     Status: Abnormal   Collection Time: 10/21/19  9:51 PM  Result Value Ref Range   Glucose-Capillary 165 (H) 70 - 99 mg/dL  CULTURE, BLOOD (ROUTINE X 2) w Reflex to ID Panel     Status: None (Preliminary result)   Collection Time: 10/21/19 10:04 PM   Specimen: BLOOD  Result Value Ref Range   Specimen Description BLOOD RIGHT ANTECUBITAL    Special Requests      BOTTLES DRAWN AEROBIC AND ANAEROBIC Blood Culture results may not be optimal due to an excessive volume of blood received in culture bottles   Culture      NO GROWTH < 12 HOURS Performed at [facility], [address], [place], [place] [address]    Report Status PENDING   CULTURE, BLOOD (ROUTINE X 2) w Reflex to ID Panel     Status: None (Preliminary result)   Collection Time: 10/21/19 10:04 PM   Specimen: BLOOD  Result Value Ref Range   Specimen Description BLOOD RIGHT ANTECUBITAL    Special Requests      BOTTLES DRAWN AEROBIC AND ANAEROBIC Blood Culture results may not  be optimal due to an excessive volume of blood received in culture bottles   Culture      NO GROWTH < 12 HOURS Performed at [facility], [address] [place], [place] [address]    Report Status PENDING   Hemoglobin A1c     Status: Abnormal   Collection Time: 10/21/19 10:05 PM  Result Value Ref Range   Hgb A1c MFr Bld 6.9 (H) 4.8 - 5.6 %    Comment: (NOTE) Pre diabetes:          5.7%-6.4% Diabetes:              >6.4% Glycemic control for   <7.0% adults with diabetes    Mean Plasma Glucose 151.33 mg/dL    Comment: Performed at [facility], [place] [address] [place], [place] [address]  APTT     Status: None   Collection Time: 10/21/19 10:05 PM  Result Value Ref Range   aPTT 32 24 - 36 seconds    Comment: Performed at [facility], [address] [place], [place] [id number]  Basic metabolic panel     Status: Abnormal   Collection Time: 10/22/19  4:36 AM  Result Value Ref Range   Sodium 137 135 -  145 mmol/L   Potassium 5.2 (H) 3.5 - 5.1 mmol/L   Chloride 99 98 - 111 mmol/L   CO2 25 22 - 32 mmol/L   Glucose, Bld 168 (H) 70 - 99 mg/dL   BUN 34 (H) 8 - 23 mg/dL   Creatinine, Ser 1.45 (H) 0.61 - 1.24 mg/dL   Calcium 8.2 (L) 8.9 - 10.3 mg/dL   GFR calc non Af Amer 43 (L) >60 mL/min   GFR calc Af Amer 50 (L) >60 mL/min   Anion gap 13 5 - 15    Comment: Performed at [facility], [address] [place], [place] [address]  CBC     Status: Abnormal   Collection Time: 10/22/19  4:36 AM  Result Value Ref Range   WBC 15.9 (H) 4.0 - 10.5 K/uL   RBC 3.64 (L) 4.22 - 5.81 MIL/uL   Hemoglobin 10.8 (L) 13.0 - 17.0 g/dL   HCT 33.7 (L) 39.0 - 52.0 %   MCV 92.6 80.0 - 100.0 fL   MCH 29.7 26.0 - 34.0 pg   MCHC 32.0 30.0 - 36.0 g/dL   RDW 12.4 11.5 - 15.5 %   Platelets 600 (H) 150 - 400 K/uL   nRBC 0.0 0.0 - 0.2 %    Comment: Performed at [facility], [address] [place], [place] [address]  Glucose, capillary     Status: Abnormal    Collection Time: 10/22/19  8:01 AM  Result Value Ref Range   Glucose-Capillary 139 (H) 70 - 99 mg/dL  Potassium     Status: None   Collection Time: 10/22/19 10:35 AM  Result Value Ref Range   Potassium 5.1 3.5 - 5.1 mmol/L    Comment: Performed at [facility], [address] [place], [place] [address]  Glucose, capillary     Status: Abnormal   Collection Time: 10/22/19 11:52 AM  Result Value Ref Range   Glucose-Capillary 210 (H) 70 - 99 mg/dL  Glucose, capillary     Status: Abnormal   Collection Time: 10/22/19  5:06 PM  Result Value Ref Range   Glucose-Capillary 179 (H) 70 - 99 mg/dL  CBC with Differential/Platelet     Status: Abnormal   Collection Time: 10/22/19  7:46 PM  Result Value Ref Range   WBC 15.5 (H) 4.0 - 10.5 K/uL   RBC 3.51 (L) 4.22 - 5.81 MIL/uL   Hemoglobin 10.3 (L) 13.0 - 17.0 g/dL   HCT 32.6 (L) 39.0 - 52.0 %   MCV 92.9 80.0 - 100.0 fL   MCH 29.3 26.0 - 34.0 pg   MCHC 31.6 30.0 - 36.0 g/dL   RDW 12.3 11.5 - 15.5 %   Platelets 571 (H) 150 - 400 K/uL   nRBC 0.0 0.0 - 0.2 %   Neutrophils Relative % 93 %   Neutro Abs 14.5 (H) 1.7 - 7.7 K/uL   Lymphocytes Relative 1 %   Lymphs Abs 0.1 (L) 0.7 - 4.0 K/uL   Monocytes Relative 5 %   Monocytes Absolute 0.7 0.1 - 1.0 K/uL   Eosinophils Relative 0 %   Eosinophils Absolute 0.0 0.0 - 0.5 K/uL   Basophils Relative 0 %   Basophils Absolute 0.0 0.0 - 0.1 K/uL   Immature Granulocytes 1 %   Abs Immature Granulocytes 0.11 (H) 0.00 - 0.07 K/uL    Comment: Performed at [facility], [address], [place],  [address]  Comprehensive metabolic panel     Status: Abnormal  Collection Time: 10/22/19  7:46 PM  Result Value Ref Range   Sodium 133 (L) 135 - 145 mmol/L   Potassium 5.2 (H) 3.5 - 5.1 mmol/L   Chloride 95 (L) 98 - 111 mmol/L   CO2 27 22 - 32 mmol/L   Glucose, Bld 223 (H) 70 - 99 mg/dL   BUN 41 (H) 8 - 23 mg/dL   Creatinine, Ser 1.53 (H) 0.61 - 1.24 mg/dL   Calcium 8.0 (L) 8.9 - 10.3  mg/dL   Total Protein 6.2 (L) 6.5 - 8.1 g/dL   Albumin 2.7 (L) 3.5 - 5.0 g/dL   AST 16 15 - 41 U/L   ALT 27 0 - 44 U/L   Alkaline Phosphatase 59 38 - 126 U/L   Total Bilirubin 0.7 0.3 - 1.2 mg/dL   GFR calc non Af Amer 41 (L) >60 mL/min   GFR calc Af Amer 47 (L) >60 mL/min   Anion gap 11 5 - 15    Comment: Performed at [facility], [address] [place], [place] [address]  Protime-INR     Status: Abnormal   Collection Time: 10/22/19  7:46 PM  Result Value Ref Range   Prothrombin Time 15.3 (H) 11.4 - 15.2 seconds   INR 1.2 0.8 - 1.2    Comment: (NOTE) INR goal varies based on device and disease states. Performed at [facility], [address] [place], [place] [address]   APTT     Status: None   Collection Time: 10/22/19  7:46 PM  Result Value Ref Range   aPTT 33 24 - 36 seconds    Comment: Performed at [facility], [address] [place], [place] [address]  Lipase, blood     Status: Abnormal   Collection Time: 10/22/19  7:46 PM  Result Value Ref Range   Lipase 77 (H) 11 - 51 U/L    Comment: Performed at [facility], [address] [place], [place] [address]  Glucose, capillary     Status: Abnormal   Collection Time: 10/22/19  7:47 PM  Result Value Ref Range   Glucose-Capillary 204 (H) 70 - 99 mg/dL   CT ABDOMEN PELVIS WO CONTRAST  Result Date: 10/22/2019 CLINICAL DATA:  Abdominal pain. Abdominal distension. Nausea since 3 p.m. today. EXAM: CT ABDOMEN AND PELVIS WITHOUT CONTRAST TECHNIQUE: Multidetector CT imaging of the abdomen and pelvis was performed following the standard protocol without IV contrast. COMPARISON:  Chest radiograph and chest CT of 1 day prior. Most recent abdominopelvic CT of 08/06/2019 FINDINGS: Lower chest: Left lower and posterior left upper lobe patchy consolidation. Left pleural drain/chest tube in place with small but incompletely imaged left-sided pneumothorax. This was detailed on yesterday's chest  radiograph. Normal heart size. Multivessel coronary artery atherosclerosis. Hepatobiliary: Normal liver. Small gallstones. Borderline to mild gallbladder distension. No surrounding edema or biliary duct dilatation. Pancreas: Normal pancreas for age, without duct dilatation or acute inflammation. Spleen: Normal in size, without focal abnormality. Adrenals/Urinary Tract: Normal adrenal glands. Mild renal cortical thinning bilaterally. No renal calculi or hydronephrosis. No hydroureter or ureteric calculi. Mild bladder distension. Stomach/Bowel: Mild gaseous distension of the stomach. Primarily gas-filled colon, including at up to 5.8 cm. The descending duodenum is relatively normal in caliber. There is a transverse duodenal diverticulum. Diffuse gas-filled loops of small bowel, on the order of maximally 2.3 cm. No pneumatosis or free intraperitoneal air. Vascular/Lymphatic: Aortic and branch vessel atherosclerosis. No abdominopelvic adenopathy. Reproductive: Normal prostate. Other: No significant free fluid.  Tiny fat containing ventral abdominal wall hernia on 39/2. Musculoskeletal: Right acetabular sclerotic lesion is likely a bone island. Lumbosacral spondylosis with mild convex right lumbar spine curvature. IMPRESSION: 1. Mild gaseous distension of the stomach with gas-filled upper normal large and small bowel loops. Favor adynamic ileus. 2. Cholelithiasis with borderline gallbladder distension, but no specific evidence of acute cholecystitis. 3. Left base airspace disease, favoring pneumonia. Left-sided small bore chest tube in place with small pneumothorax, as on yesterday's chest radiograph. 4. Coronary artery atherosclerosis. Aortic Atherosclerosis (ICD10-I70.0). 5. Borderline bladder distension, without hydronephrosis. Electronically Signed   By: Abigail Miyamoto M.D.   On: 10/22/2019 17:56   DG Chest 2 View  Result Date: 10/21/2019 CLINICAL DATA:  85 year old male with shortness of breath. Patient is not  able to raise his left arm. EXAM: CHEST - 2 VIEW COMPARISON:  Chest radiograph dated 10/02/2014. FINDINGS: There is a large left pleural effusion with compressive atelectasis of the majority of the left lung. A small aerated portion of the left upper lobe remains. The right lung is clear. No pneumothorax. The cardiac borders are silhouetted. Atherosclerotic calcification of the aorta. No acute osseous pathology. IMPRESSION: Large left pleural effusion with compressive atelectasis of the majority of the left lung. Pneumonia is not excluded. Electronically Signed   By: Anner Crete M.D.   On: 10/21/2019 11:24   CT Chest Wo Contrast  Result Date: 10/21/2019 CLINICAL DATA:  Abnormal x-ray EXAM: CT CHEST WITHOUT CONTRAST TECHNIQUE: Multidetector CT imaging of the chest was performed following the standard protocol without IV contrast. COMPARISON:  None. FINDINGS: Cardiovascular: Normal heart size. Coronary artery calcification. No significant pericardial effusion. Calcified plaque along the thoracic aorta. Mediastinum/Nodes: Rightward mediastinal shift. No mediastinal or axillary adenopathy. Unremarkable thyroid. Lungs/Pleura: Large left pleural effusion with near complete atelectasis of the left lung. There is partial aeration of the left upper lobe. Right lung remains aerated with chronic interstitial changes at the lung base. Upper Abdomen: No acute abnormality. Musculoskeletal: No chest wall mass or significant osseous abnormality. IMPRESSION: Large left pleural effusion with compressive atelectasis of the majority of the left lung. Aortic Atherosclerosis (ICD10-I70.0). Electronically Signed   By: Macy Mis M.D.   On: 10/21/2019 12:35   DG Chest Portable 1 View  Result Date: 10/21/2019 CLINICAL DATA:  Left-sided chest tube placement. EXAM: PORTABLE CHEST 1 VIEW COMPARISON:  CT chest and chest x-ray from same day. FINDINGS: New left-sided chest tube at the lung base. Resolved left pleural effusion.  Small left pneumothorax. Hazy density throughout the left lung. The right lung is clear. Normal heart size. No acute osseous abnormality. IMPRESSION: 1. Interval placement of a left-sided chest tube with resolved left pleural effusion. 2. Small left pneumothorax, presumably ex vacuo due to incomplete re-expansion of the left lung. 3. Hazy density throughout the left lung could reflect atelectasis and/or re-expansion pulmonary edema. Electronically Signed   By: Titus Dubin M.D.   On: 10/21/2019 14:11   US Abdomen Limited RUQ  Result Date: 10/22/2019 CLINICAL DATA:  Abdominal pain, distended gallbladder on CT EXAM: ULTRASOUND ABDOMEN LIMITED RIGHT UPPER QUADRANT COMPARISON:  CT 10/22/2019 FINDINGS: Gallbladder: Sludge within the gallbladder focal echogenic area along the anterior wall of the gallbladder measuring 1.9 cm. Increased gallbladder wall thickness at 8.6 mm. Negative sonographic Murphy. Small amount of pericholecystic fluid. Common bile duct: Diameter: 5.1 mm Liver: Limited visualization. Liver is echogenic. No gross focal hepatic abnormality. Portal vein is patent on color Doppler imaging with normal direction of blood flow  towards the liver. Other: None. IMPRESSION: 1. Sludge within the gallbladder, with thickened gallbladder wall, pericholecystic fluid, but negative sonographic Murphy. Acalculous cholecystitis remains a concern, although liver disease and other edema forming states can also produce thickening of the gallbladder wall. Correlation with nuclear medicine hepatobiliary imaging may be considered. Additional finding of 1.9 cm echogenic mass along the anterior wall of the gallbladder, either representing tumefactive sludge or potentially large polyp. 2. Limited visualization of the liver secondary to patient mobility. Liver is echogenic consistent with steatosis and or hepatocellular disease. Electronically Signed   By: Donavan Foil M.D.   On: 10/22/2019 19:14    Pending  Labs Unresulted Labs (From admission, onward)    Start     Ordered   10/23/19 0500  CBC  Daily,   STAT     10/22/19 1434   10/23/19 [id number]  Basic metabolic panel  Daily,   STAT     10/22/19 1434   10/22/19 2150  MRSA PCR Screening  Once,   STAT     10/22/19 2150   10/22/19 1818  CBC with Differential  Once,   STAT     10/22/19 1818   10/21/19 1516  Cholesterol, body fluid  Add-on,   AD    Comments: Pleural fluid    10/21/19 1515   10/21/19 1516  Rheumatoid factors, fluid  Add-on,   AD    Comments: Pleural fluid    10/21/19 1515   10/21/19 1312  Body fluid culture (includes gram stain)  ONCE - STAT,   STAT    Question:  Are there also cytology or pathology orders on this specimen?  Answer:  Yes   10/21/19 1311          Vitals/Pain Today's Vitals   10/22/19 2000 10/22/19 2030 10/22/19 2100 10/22/19 2130  BP: (!) 148/73 (!) 153/79 (!) 143/73 138/74  Pulse: (!) 104 (!) 101 95 91  Resp: (!) 23 19 15 16   Temp:      TempSrc:      SpO2: 98% 99% 98% 100%  Weight:      Height:      PainSc:        Isolation Precautions No active isolations  Medications Medications  sodium chloride flush (NS) 0.9 % injection 3 mL (3 mLs Intravenous Not Given 10/21/19 1157)  albuterol (VENTOLIN HFA) 108 (90 Base) MCG/ACT inhaler 2 puff (has no administration in time range)  dextromethorphan-guaiFENesin (MUCINEX DM) 30-600 MG per 12 hr tablet 1 tablet (1 tablet Oral Given 10/22/19 0900)  ondansetron (ZOFRAN) injection 4 mg (4 mg Intravenous Given 10/22/19 1632)  acetaminophen (TYLENOL) tablet 650 mg (has no administration in time range)  polyethylene glycol (MIRALAX / GLYCOLAX) packet 17 g (has no administration in time range)  insulin aspart (novoLOG) injection 0-9 Units (0 Units Subcutaneous Not Given 10/22/19 1843)  insulin aspart (novoLOG) injection 0-5 Units (0 Units Subcutaneous Not Given 10/21/19 2152)  predniSONE (DELTASONE) tablet 20 mg (20 mg Oral Given 10/22/19 0901)  linaclotide  (LINZESS) capsule 145 mcg (145 mcg Oral Given 10/22/19 0901)  finasteride (PROSCAR) tablet 5 mg (5 mg Oral Given 10/22/19 0900)  Tofacitinib Citrate TABS 5 mg (5 mg Oral Not Given 10/22/19 1027)  morphine 2 MG/ML injection 1 mg (1 mg Intravenous Given 10/22/19 1631)  oxyCODONE-acetaminophen (PERCOCET/ROXICET) 5-325 MG per tablet 2 tablet (2 tablets Oral Given 10/22/19 1218)  docusate (COLACE) 50 MG/5ML liquid 200 mg (200 mg Oral Given 10/22/19 1218)  bisacodyl (DULCOLAX) EC tablet 5  mg (has no administration in time range)  scopolamine (TRANSDERM-SCOP) 1 MG/3DAYS 1.5 mg (1.5 mg Transdermal Patch Applied 10/22/19 1940)  hydrALAZINE (APRESOLINE) injection 20 mg (has no administration in time range)  pantoprazole (PROTONIX) injection 40 mg (has no administration in time range)  Chlorhexidine Gluconate Cloth 2 % PADS 6 each (has no administration in time range)  lidocaine-EPINEPHrine (XYLOCAINE W/EPI) 2 %-1:100000 (with pres) injection 30 mL (30 mLs Infiltration Given 10/21/19 1317)  HYDROmorphone (DILAUDID) injection 1 mg (1 mg Intravenous Given 10/21/19 1353)  sodium zirconium cyclosilicate (LOKELMA) packet 5 g (5 g Oral Given 10/21/19 1918)  hydrocortisone sodium succinate (SOLU-CORTEF) 100 MG injection 100 mg (100 mg Intravenous Given 10/21/19 1952)  sodium chloride 0.9 % bolus 500 mL (500 mLs Intravenous New Bag/Given 10/22/19 1936)  ondansetron (ZOFRAN) injection 4 mg (4 mg Intravenous Given 10/22/19 1822)  morphine 4 MG/ML injection 4 mg (4 mg Intravenous Given 10/22/19 1822)  pantoprazole (PROTONIX) injection 40 mg (40 mg Intravenous Given 10/22/19 1937)  lidocaine (XYLOCAINE) 2 % jelly 1 application (1 application Topical Given 10/22/19 1845)    Mobility walks with device Moderate fall risk   Focused Assessments Pulmonary Assessment Handoff:  Lung sounds: L Breath Sounds: Diminished R Breath Sounds: Absent O2 Device: Nasal Cannula O2 Flow Rate (L/min): 2 L/min   ,  GI    R Recommendations: See Admitting Provider Note  Report given to:   Additional Notes: VSS, pt's mouth has been dry so mouth swabs utilized.

## 2019-10-22 NOTE — ED Notes (Signed)
Provider Eppie Gibson messaged regarding:  Hey! please change route for medications. Some are ordered orally but pt has NG for decompression. Thanks "  Awaiting response.

## 2019-10-22 NOTE — ED Provider Notes (Signed)
Procedures    ----------------------------------------- 6:18 PM on 10/22/2019 -----------------------------------------  Called to bedside due to concern about decompensation from ED nurse that is taking care of this patient while he continues to board waiting for an inpatient bed.  She notes diaphoresis and what looks like bloody emesis.  Patient complaining of severe pain.  Nurse notes that she attempted to contact the admitting physician but was unable to get a response.  On assessment he is tachycardic, diaphoretic.  A lot of dry heaves with a small amount of bloody vomit.  Abdomen is markedly distended, diffusely tender and taut.  Recently completed CT scan shows ileus with multiple dilated loops of bowel, no perforation.  Breathing appears unlabored.  I have ordered a repeat lab panel including CMP, lipase, CBC, coags given the amount of bloody fluid output and need to trend his blood counts.  Morphine 4 mg IV, Zofran 4 mg IV, 500 mL of IV saline for hydration.  IV Protonix for gastric protection and likely upper GI bleed.  Insert NG tube for GI decompression and to avoid aspiration..  Final diagnoses:  Pleural effusion, left  Ileus with bowel obstruction    Carrie Mew, MD 10/22/19 1821

## 2019-10-22 NOTE — ED Notes (Signed)
Ultrasound at bedside

## 2019-10-23 ENCOUNTER — Inpatient Hospital Stay: Payer: Medicare Other

## 2019-10-23 LAB — BASIC METABOLIC PANEL
Anion gap: 10 (ref 5–15)
BUN: 42 mg/dL — ABNORMAL HIGH (ref 8–23)
CO2: 27 mmol/L (ref 22–32)
Calcium: 8 mg/dL — ABNORMAL LOW (ref 8.9–10.3)
Chloride: 99 mmol/L (ref 98–111)
Creatinine, Ser: 1.35 mg/dL — ABNORMAL HIGH (ref 0.61–1.24)
GFR calc Af Amer: 55 mL/min — ABNORMAL LOW (ref >60)
GFR calc non Af Amer: 47 mL/min — ABNORMAL LOW (ref >60)
Glucose, Bld: 149 mg/dL — ABNORMAL HIGH (ref 70–99)
Potassium: 5.5 mmol/L — ABNORMAL HIGH (ref 3.5–5.1)
Sodium: 136 mmol/L (ref 135–145)

## 2019-10-23 LAB — CBC
HCT: 29.4 % — ABNORMAL LOW (ref 39.0–52.0)
Hemoglobin: 9.8 g/dL — ABNORMAL LOW (ref 13.0–17.0)
MCH: 29.5 pg (ref 26.0–34.0)
MCHC: 33.3 g/dL (ref 30.0–36.0)
MCV: 88.6 fL (ref 80.0–100.0)
Platelets: 524 10*3/uL — ABNORMAL HIGH (ref 150–400)
RBC: 3.32 MIL/uL — ABNORMAL LOW (ref 4.22–5.81)
RDW: 12.3 % (ref 11.5–15.5)
WBC: 15 10*3/uL — ABNORMAL HIGH (ref 4.0–10.5)
nRBC: 0 % (ref 0.0–0.2)

## 2019-10-23 LAB — POTASSIUM: Potassium: 4.2 mmol/L (ref 3.5–5.1)

## 2019-10-23 LAB — CHOLESTEROL, BODY FLUID: Cholesterol, Fluid: 91 mg/dL

## 2019-10-23 LAB — GLUCOSE, CAPILLARY
Glucose-Capillary: 144 mg/dL — ABNORMAL HIGH (ref 70–99)
Glucose-Capillary: 165 mg/dL — ABNORMAL HIGH (ref 70–99)
Glucose-Capillary: 66 mg/dL — ABNORMAL LOW (ref 70–99)
Glucose-Capillary: 136 mg/dL — ABNORMAL HIGH (ref 70–99)
Glucose-Capillary: 165 mg/dL — ABNORMAL HIGH (ref 70–99)
Glucose-Capillary: 189 mg/dL — ABNORMAL HIGH (ref 70–99)

## 2019-10-23 LAB — MRSA PCR SCREENING: MRSA by PCR: NEGATIVE

## 2019-10-23 MED ORDER — DEXTROSE 50 % IV SOLN
12.5000 g | INTRAVENOUS | Status: AC
  Administered 2019-10-23: 12.5 g via INTRAVENOUS

## 2019-10-23 MED ORDER — SODIUM BICARBONATE 8.4 % IV SOLN: 100.0000 meq | Freq: Once | INTRAVENOUS | Status: DC

## 2019-10-23 MED ORDER — MORPHINE SULFATE (PF) 4 MG/ML IV SOLN
4.0000 mg | INTRAVENOUS | Status: DC | PRN
  Administered 2019-10-31 – 2019-11-03 (×4): 4 mg via INTRAVENOUS
  Filled 2019-10-23 (×4): qty 1

## 2019-10-23 MED ORDER — CALCIUM GLUCONATE-NACL 1-0.675 GM/50ML-% IV SOLN
1.0000 g | Freq: Once | INTRAVENOUS | Status: AC
  Administered 2019-10-23: 1000 mg via INTRAVENOUS
  Filled 2019-10-23: qty 50

## 2019-10-23 MED ORDER — INSULIN ASPART 100 UNIT/ML ~~LOC~~ SOLN
10.0000 [IU] | Freq: Once | SUBCUTANEOUS | Status: AC
  Administered 2019-10-23: 10 [IU] via SUBCUTANEOUS
  Filled 2019-10-23: qty 1

## 2019-10-23 MED ORDER — FUROSEMIDE 10 MG/ML IJ SOLN
40.0000 mg | Freq: Once | INTRAMUSCULAR | Status: AC
  Administered 2019-10-23: 40 mg via INTRAVENOUS
  Filled 2019-10-23: qty 4

## 2019-10-23 MED ORDER — DEXTROSE 5 % IV BOLUS
250.0000 mL | Freq: Once | INTRAVENOUS | Status: AC
  Administered 2019-10-23: 250 mL via INTRAVENOUS

## 2019-10-23 MED ORDER — METHYLPREDNISOLONE SODIUM SUCC 40 MG IJ SOLR
16.0000 mg | INTRAMUSCULAR | Status: DC
  Administered 2019-10-23 – 2019-10-27 (×5): 16 mg via INTRAVENOUS
  Filled 2019-10-23 (×5): qty 1

## 2019-10-23 MED ORDER — SODIUM BICARBONATE 8.4 % IV SOLN
100.0000 meq | Freq: Once | INTRAVENOUS | Status: AC
  Administered 2019-10-23: 100 meq via INTRAVENOUS
  Filled 2019-10-23: qty 50

## 2019-10-23 MED ORDER — INSULIN REGULAR HUMAN 100 UNIT/ML IJ SOLN
10.0000 [IU] | Freq: Once | INTRAMUSCULAR | Status: DC
  Filled 2019-10-23: qty 3

## 2019-10-23 MED ORDER — MORPHINE SULFATE (PF) 2 MG/ML IV SOLN
2.0000 mg | INTRAVENOUS | Status: DC | PRN
  Administered 2019-10-23 – 2019-11-02 (×29): 2 mg via INTRAVENOUS
  Filled 2019-10-23 (×30): qty 1

## 2019-10-23 NOTE — Consult Note (Signed)
Pulmonary Medicine          Date: 10/23/2019,   MRN# [id number] David Cisneros [birth year]     AdmissionWeight: 72.6 kg                 CurrentWeight: 67.5 kg   Referring physician: Dr Jimmye Norman.    CHIEF COMPLAINT:   Large left pleural effusion, chest tube management, pneumothorax.    HISTORY OF PRESENT ILLNESS   David Cisneros is a 86 y.o. male with medical history significant of former smoker, hypertension, diabetes mellitus, rheumatoid arthritis, chronic back pain, CKD stage IIIa, who presents with shortness of breath. Patient states that he has been having shortness of breath for more than 2 weeks, which has worsened in the past several days. Initially no chest pain reported to ED physician, but developed chest pain after chest tubes were placed on the left side.  No cough, fever or chills.  Patient is constipated, but no nausea, vomiting, diarrhea or abdominal pain.  No symptoms of UTI.  No unilateral weakness. Patient was found to have a large left-sided pleural effusion by CXR. Left side chest tube is placed in ED. Pt was found to have WBC 13.8, negative RVP for COVID-19 test, potassium 5.4, slightly worsening renal function, temperature normal, blood pressure 180/81, tachycardia, oxygen sat 96% 2 L nasal cannula oxygen. Pt is based on stepdown bed for observation.  CT-chest showed: large left pleural effusion with compressive atelectasis of the majority of the left lung. Pulmonary consultation placed by hospitalist service for further management of large effusion with chest tube placement.     PAST MEDICAL HISTORY   Past Medical History:  Diagnosis Date  . Cancer ([place])    skin   . Chronic back pain   . Collagen vascular disease ([facility])    RA  . Diabetes mellitus without complication ([place])   . Hypertension      SURGICAL HISTORY   History reviewed. No pertinent surgical history.   FAMILY HISTORY   Family History  Problem Relation Age of Onset  . Breast  cancer Sister   . Heart attack Brother      SOCIAL HISTORY   Social History   Tobacco Use  . Smoking status: Never Smoker  . Smokeless tobacco: Never Used  Substance Use Topics  . Alcohol use: Not Currently  . Drug use: Not Currently     MEDICATIONS    Home Medication:    Current Medication:  Current Facility-Administered Medications:  .  acetaminophen (TYLENOL) tablet 650 mg, 650 mg, Oral, Q6H PRN, Ivor Costa, MD .  albuterol (VENTOLIN HFA) 108 (90 Base) MCG/ACT inhaler 2 puff, 2 puff, Inhalation, Q4H PRN, Ivor Costa, MD .  bisacodyl (DULCOLAX) EC tablet 5 mg, 5 mg, Oral, Daily PRN, Wyvonnia Dusky, MD .  calcium gluconate 1 g/ 50 mL sodium chloride IVPB, 1 g, Intravenous, Once, Wyvonnia Dusky, MD, Last Rate: 50 mL/hr at 10/23/19 0940, 1,000 mg at 10/23/19 0940 .  Chlorhexidine Gluconate Cloth 2 % PADS 6 each, 6 each, Topical, Daily, Wyvonnia Dusky, MD, 6 each at 10/23/19 0818 .  dextromethorphan-guaiFENesin (MUCINEX DM) 30-600 MG per 12 hr tablet 1 tablet, 1 tablet, Oral, BID, Ivor Costa, MD, 1 tablet at 10/22/19 0900 .  docusate (COLACE) 50 MG/5ML liquid 200 mg, 200 mg, Oral, BID, Wyvonnia Dusky, MD, 200 mg at 10/22/19 1218 .  finasteride (PROSCAR) tablet 5 mg, 5 mg, Oral, Daily, Ivor Costa, MD, 5 mg  at 10/22/19 0900 .  hydrALAZINE (APRESOLINE) injection 20 mg, 20 mg, Intravenous, Q6H PRN, Wyvonnia Dusky, MD .  insulin aspart (novoLOG) injection 0-5 Units, 0-5 Units, Subcutaneous, QHS, Niu, Xilin, MD .  insulin aspart (novoLOG) injection 0-9 Units, 0-9 Units, Subcutaneous, TID WC, Ivor Costa, MD, 3 Units at 10/22/19 1220 .  linaclotide (LINZESS) capsule 145 mcg, 145 mcg, Oral, q morning - 10a, Ivor Costa, MD, 145 mcg at 10/22/19 0901 .  morphine 2 MG/ML injection 1 mg, 1 mg, Intravenous, Q2H PRN, Sharion Settler, NP, 1 mg at 10/23/19 0814 .  ondansetron (ZOFRAN) injection 4 mg, 4 mg, Intravenous, Q8H PRN, Ivor Costa, MD, 4 mg at 10/23/19 0912 .   oxyCODONE-acetaminophen (PERCOCET/ROXICET) 5-325 MG per tablet 2 tablet, 2 tablet, Oral, Q6H PRN, Sharion Settler, NP, 2 tablet at 10/22/19 1218 .  pantoprazole (PROTONIX) injection 40 mg, 40 mg, Intravenous, Q24H, Wyvonnia Dusky, MD, 40 mg at 10/23/19 0844 .  polyethylene glycol (MIRALAX / GLYCOLAX) packet 17 g, 17 g, Oral, Daily PRN, Ivor Costa, MD .  predniSONE (DELTASONE) tablet 20 mg, 20 mg, Oral, Daily, Ivor Costa, MD, 20 mg at 10/22/19 0901 .  scopolamine (TRANSDERM-SCOP) 1 MG/3DAYS 1.5 mg, 1 patch, Transdermal, Q72H, Wyvonnia Dusky, MD, 1.5 mg at 10/22/19 1940 .  sodium chloride flush (NS) 0.9 % injection 3 mL, 3 mL, Intravenous, Once, Ivor Costa, MD .  Tofacitinib Citrate TABS 5 mg, 5 mg, Oral, BID, Ivor Costa, MD    ALLERGIES   Sulfa antibiotics     REVIEW OF SYSTEMS    Review of Systems:  Gen:  Denies  fever, sweats, chills weigh loss  HEENT: Denies blurred vision, double vision, ear pain, eye pain, hearing loss, nose bleeds, sore throat Cardiac:  No dizziness, chest pain or heaviness, chest tightness,edema Resp:   Denies cough or sputum porduction, shortness of breath,wheezing, hemoptysis,  Gi: Denies swallowing difficulty, stomach pain, nausea or vomiting, diarrhea, constipation, bowel incontinence Gu:  Denies bladder incontinence, burning urine Ext:   Denies Joint pain, stiffness or swelling Skin: Denies  skin rash, easy bruising or bleeding or hives Endoc:  Denies polyuria, polydipsia , polyphagia or weight change Psych:   Denies depression, insomnia or hallucinations   Other:  All other systems negative   VS: BP 132/72   Pulse 97   Temp 98.5 F (36.9 C) (Oral)   Resp (!) 21   Ht 5\' 5"  (1.651 m)   Wt 67.5 kg   SpO2 98%   BMI 24.76 kg/m      PHYSICAL EXAM    GENERAL:NAD, no fevers, chills, no weakness no fatigue HEAD: Normocephalic, atraumatic.  EYES: Pupils equal, round, reactive to light. Extraocular muscles intact. No scleral icterus.    MOUTH: Moist mucosal membrane. Dentition intact. No abscess noted.  EAR, NOSE, THROAT: Clear without exudates. No external lesions.  NECK: Supple. No thyromegaly. No nodules. No JVD.  PULMONARY: Decreased bs at left, left chest tube with bloody drainage and + air leak CARDIOVASCULAR: S1 and S2. Regular rate and rhythm. No murmurs, rubs, or gallops. No edema. Pedal pulses 2+ bilaterally.  GASTROINTESTINAL: Soft, nontender, nondistended. No masses. Positive bowel sounds. No hepatosplenomegaly.  MUSCULOSKELETAL: No swelling, clubbing, or edema. Range of motion full in all extremities.  NEUROLOGIC: Cranial nerves II through XII are intact. No gross focal neurological deficits. Sensation intact. Reflexes intact.  SKIN: No ulceration, lesions, rashes, or cyanosis. Skin warm and dry. Turgor intact.  PSYCHIATRIC: Mood, affect within normal limits. The patient  is awake, alert and oriented x 3. Insight, judgment intact.        IMAGING    CT ABDOMEN PELVIS WO CONTRAST  Result Date: 10/22/2019 CLINICAL DATA:  Abdominal pain. Abdominal distension. Nausea since 3 p.m. today. EXAM: CT ABDOMEN AND PELVIS WITHOUT CONTRAST TECHNIQUE: Multidetector CT imaging of the abdomen and pelvis was performed following the standard protocol without IV contrast. COMPARISON:  Chest radiograph and chest CT of 1 day prior. Most recent abdominopelvic CT of 08/06/2019 FINDINGS: Lower chest: Left lower and posterior left upper lobe patchy consolidation. Left pleural drain/chest tube in place with small but incompletely imaged left-sided pneumothorax. This was detailed on yesterday's chest radiograph. Normal heart size. Multivessel coronary artery atherosclerosis. Hepatobiliary: Normal liver. Small gallstones. Borderline to mild gallbladder distension. No surrounding edema or biliary duct dilatation. Pancreas: Normal pancreas for age, without duct dilatation or acute inflammation. Spleen: Normal in size, without focal abnormality.  Adrenals/Urinary Tract: Normal adrenal glands. Mild renal cortical thinning bilaterally. No renal calculi or hydronephrosis. No hydroureter or ureteric calculi. Mild bladder distension. Stomach/Bowel: Mild gaseous distension of the stomach. Primarily gas-filled colon, including at up to 5.8 cm. The descending duodenum is relatively normal in caliber. There is a transverse duodenal diverticulum. Diffuse gas-filled loops of small bowel, on the order of maximally 2.3 cm. No pneumatosis or free intraperitoneal air. Vascular/Lymphatic: Aortic and branch vessel atherosclerosis. No abdominopelvic adenopathy. Reproductive: Normal prostate. Other: No significant free fluid. Tiny fat containing ventral abdominal wall hernia on 39/2. Musculoskeletal: Right acetabular sclerotic lesion is likely a bone island. Lumbosacral spondylosis with mild convex right lumbar spine curvature. IMPRESSION: 1. Mild gaseous distension of the stomach with gas-filled upper normal large and small bowel loops. Favor adynamic ileus. 2. Cholelithiasis with borderline gallbladder distension, but no specific evidence of acute cholecystitis. 3. Left base airspace disease, favoring pneumonia. Left-sided small bore chest tube in place with small pneumothorax, as on yesterday's chest radiograph. 4. Coronary artery atherosclerosis. Aortic Atherosclerosis (ICD10-I70.0). 5. Borderline bladder distension, without hydronephrosis. Electronically Signed   By: Abigail Miyamoto M.D.   On: 10/22/2019 17:56   DG Chest 2 View  Result Date: 10/21/2019 CLINICAL DATA:  86 year old male with shortness of breath. Patient is not able to raise his left arm. EXAM: CHEST - 2 VIEW COMPARISON:  Chest radiograph dated 10/02/2014. FINDINGS: There is a large left pleural effusion with compressive atelectasis of the majority of the left lung. A small aerated portion of the left upper lobe remains. The right lung is clear. No pneumothorax. The cardiac borders are silhouetted.  Atherosclerotic calcification of the aorta. No acute osseous pathology. IMPRESSION: Large left pleural effusion with compressive atelectasis of the majority of the left lung. Pneumonia is not excluded. Electronically Signed   By: Anner Crete M.D.   On: 10/21/2019 11:24   CT Chest Wo Contrast  Result Date: 10/21/2019 CLINICAL DATA:  Abnormal x-ray EXAM: CT CHEST WITHOUT CONTRAST TECHNIQUE: Multidetector CT imaging of the chest was performed following the standard protocol without IV contrast. COMPARISON:  None. FINDINGS: Cardiovascular: Normal heart size. Coronary artery calcification. No significant pericardial effusion. Calcified plaque along the thoracic aorta. Mediastinum/Nodes: Rightward mediastinal shift. No mediastinal or axillary adenopathy. Unremarkable thyroid. Lungs/Pleura: Large left pleural effusion with near complete atelectasis of the left lung. There is partial aeration of the left upper lobe. Right lung remains aerated with chronic interstitial changes at the lung base. Upper Abdomen: No acute abnormality. Musculoskeletal: No chest wall mass or significant osseous abnormality. IMPRESSION: Large left pleural  effusion with compressive atelectasis of the majority of the left lung. Aortic Atherosclerosis (ICD10-I70.0). Electronically Signed   By: Macy Mis M.D.   On: 10/21/2019 12:35   DG Chest Portable 1 View  Result Date: 10/21/2019 CLINICAL DATA:  Left-sided chest tube placement. EXAM: PORTABLE CHEST 1 VIEW COMPARISON:  CT chest and chest x-ray from same day. FINDINGS: New left-sided chest tube at the lung base. Resolved left pleural effusion. Small left pneumothorax. Hazy density throughout the left lung. The right lung is clear. Normal heart size. No acute osseous abnormality. IMPRESSION: 1. Interval placement of a left-sided chest tube with resolved left pleural effusion. 2. Small left pneumothorax, presumably ex vacuo due to incomplete re-expansion of the left lung. 3. Hazy  density throughout the left lung could reflect atelectasis and/or re-expansion pulmonary edema. Electronically Signed   By: Titus Dubin M.D.   On: 10/21/2019 14:11   US Abdomen Limited RUQ  Result Date: 10/22/2019 CLINICAL DATA:  Abdominal pain, distended gallbladder on CT EXAM: ULTRASOUND ABDOMEN LIMITED RIGHT UPPER QUADRANT COMPARISON:  CT 10/22/2019 FINDINGS: Gallbladder: Sludge within the gallbladder focal echogenic area along the anterior wall of the gallbladder measuring 1.9 cm. Increased gallbladder wall thickness at 8.6 mm. Negative sonographic Murphy. Small amount of pericholecystic fluid. Common bile duct: Diameter: 5.1 mm Liver: Limited visualization. Liver is echogenic. No gross focal hepatic abnormality. Portal vein is patent on color Doppler imaging with normal direction of blood flow towards the liver. Other: None. IMPRESSION: 1. Sludge within the gallbladder, with thickened gallbladder wall, pericholecystic fluid, but negative sonographic Murphy. Acalculous cholecystitis remains a concern, although liver disease and other edema forming states can also produce thickening of the gallbladder wall. Correlation with nuclear medicine hepatobiliary imaging may be considered. Additional finding of 1.9 cm echogenic mass along the anterior wall of the gallbladder, either representing tumefactive sludge or potentially large polyp. 2. Limited visualization of the liver secondary to patient mobility. Liver is echogenic consistent with steatosis and or hepatocellular disease. Electronically Signed   By: Donavan Foil M.D.   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   Left chest tube management    - decreased bloodly output overnight <20cc    - + Airleak on forced expiration    - good tidal motion wave   - will call thoracic surgery for possible broncho pleural fistula   Left Chest pneumothorax   -possible -ex vacuo due to endobronchial debri - will need to reimage chest   -repeat CXR    Acute blood loss anemia due to Upper GI bleed   - patient had hematemesis which has resolved   - NG tube to LIS with non bloody - bilious return    -IV protonix    Rheumatoid Arthritis Continue Xeljans and prednisone as per primary rheumatologist     Thank you Dr Jimmye Norman for allowing me to participate in the care of this patient.  Patient/Family are satisfied with care plan and all questions have been answered.  This document was prepared using Dragon voice recognition software and may include unintentional dictation errors.     Ottie Glazier, M.D.  Division of [facility]

## 2019-10-23 NOTE — Progress Notes (Signed)
Restraints remain off, patient continues to be alert and oriented and was talking on his cell phone. No issues at this time. Continue to monitor.

## 2019-10-23 NOTE — Progress Notes (Signed)
PROGRESS NOTE    David Cisneros  [id number] DOB: 01-Aug-1931 DOA: 10/21/2019 PCP: Valera Castle, MD       Assessment & Plan:   Principal Problem:   Pleural effusion on left Active Problems:   Hyperkalemia   Hypertension   Chronic back pain   CKD (chronic kidney disease), stage IIIa   Leukocytosis   Pneumothorax on left   Rheumatoid arthritis (HCC)   Left pleural effusion: Etiology unclear, likely secondary to complication from RA . S/p of left chest tube placement. Per EDP, approximately 3 L of bloody fluid were removed. Pleural fluid analysis pending. Morphine prn for pain. Pulmon following and recs apprec   Pneumothorax on left: small. S/p left chest tube & draining bloody fluid. Managed as per pulmon.   Hyperkalemia: S/p Ca gluconate, insulin, bicarb & lasix given.Cannot give veltassa or kayexalate as pt is NPO and has an NG tube. Repeat K is WNL.   Ileus: etiology unclear. Continue w/ NG w/ low intermittent suction. Possible bloody bilious vomiting. GI consulted and recs apprec  Abd pain: etiology unclear, ileus vs acalculous cholecystitis. US gallbladder shows Sludge within the gallbladder, with thickened gallbladder wall, pericholecystic fluid, but negative sonographic Murphy. Acalculous cholecystitis remains a concern, although liver disease and other edema forming states can also produce thickening of the gallbladder wall. Correlation with nuclear medicine hepatobiliary imaging may be considered. Will discuss w/ GI  Possible hematemesis: H&H down trending slightly today. No need for a transfusion at this time. GI recs apprec   Rheumatoid arthritis: at home on Xeljanz and prednisone & will continue these meds but an increased dose of prednisone from 10 to 20mg  daily. Will hold prednisone while pt is NPO and start IV solumedrol   AKI on CKDIIIa: baseline creatinine 1.33. Cr is trending down today. Will continue to monitor   Thrombocytosis: likely reactive.  Will continue to monitor   Normocytic anemia: no need for a transfusion at this time. Will continue to monitor   Hypertension: not taking medication for blood pressure at home. Hydralazine prn.  Chronic back pain: morphine prn for pain   Leukocytosis: likely secondary to steroid use. Will continue to monitor       DVT prophylaxis: SCDs secondary to bloody pleural fluid output Code Status: DNR Family Communication:  Disposition Plan:    Consultants:   Pulmon   Procedures:  S/p chest tube placement on 10/21/19; s/p NG tube placement on 10/22/19   Antimicrobials:    Subjective: Pt c/o abd pain and dry throat.  Objective: Vitals:   10/23/19 1330 10/23/19 1345 10/23/19 1400 10/23/19 1415  BP:   (!) 146/73   Pulse: (!) 102 (!) 101 (!) 102 (!) 108  Resp: 20 20 (!) 24 15  Temp:      TempSrc:      SpO2: 93% 97% 90% 94%  Weight:      Height:        Intake/Output Summary (Last 24 hours) at 10/23/2019 1439 Last data filed at 10/23/2019 1422 Gross per 24 hour  Intake 140.12 ml  Output 4895 ml  Net -4754.88 ml   Filed Weights   10/21/19 1050 10/22/19 2241  Weight: 72.6 kg 67.5 kg    Examination:  General exam: Appears calm but uncomfortable  Respiratory system: decreased breath sounds b/l. Chest tube in place draining bloody fluid Cardiovascular system: S1 & S2 +. No rubs, gallops or clicks.  Gastrointestinal system: Abdomen has mild distention, soft and tenderness to palpation.  Hypoactive  bowel sounds heard. NG tube in place draining bilious fluid  Central nervous system: Alert and oriented. Moves all 4 extremities. Psychiatry: Judgement and insight appear normal. Flat mood and affect      Data Reviewed: I have personally reviewed following labs and imaging studies  CBC: Recent Labs  Lab 10/21/19 1054 10/22/19 0436 10/22/19 1946 10/23/19 0430  WBC 13.8* 15.9* 15.5* 15.0*  NEUTROABS  --   --  14.5*  --   HGB 10.4* 10.8* 10.3* 9.8*  HCT 32.9* 33.7*  32.6* 29.4*  MCV 93.7 92.6 92.9 88.6  PLT 613* 600* 571* [id number]*   Basic Metabolic Panel: Recent Labs  Lab 10/21/19 1054 10/22/19 0436 10/22/19 1035 10/22/19 1946 10/23/19 0430 10/23/19 1138  NA 136 137  --  133* 136  --   K 5.4* 5.2* 5.1 5.2* 5.5* 4.2  CL 96* 99  --  95* 99  --   CO2 25 25  --  27 27  --   GLUCOSE 195* 168*  --  223* 149*  --   BUN 26* 34*  --  41* 42*  --   CREATININE 1.44* 1.45*  --  1.53* 1.35*  --   CALCIUM 8.7* 8.2*  --  8.0* 8.0*  --    GFR: Estimated Creatinine Clearance: 34.2 mL/min (A) (by C-G formula based on SCr of 1.35 mg/dL (H)). Liver Function Tests: Recent Labs  Lab 10/22/19 1946  AST 16  ALT 27  ALKPHOS 59  BILITOT 0.7  PROT 6.2*  ALBUMIN 2.7*   Recent Labs  Lab 10/22/19 1946  LIPASE 77*   No results for input(s): AMMONIA in the last 168 hours. Coagulation Profile: Recent Labs  Lab 10/21/19 1353 10/22/19 1946  INR 1.1 1.2   Cardiac Enzymes: No results for input(s): CKTOTAL, CKMB, CKMBINDEX, TROPONINI in the last 168 hours. BNP (last 3 results) No results for input(s): PROBNP in the last 8760 hours. HbA1C: Recent Labs    10/21/19 2205  HGBA1C 6.9*   CBG: Recent Labs  Lab 10/22/19 1947 10/22/19 2204 10/22/19 2237 10/23/19 0829 10/23/19 1044  GLUCAP 204* 181* 189* 144* 165*   Lipid Profile: No results for input(s): CHOL, HDL, LDLCALC, TRIG, CHOLHDL, LDLDIRECT in the last 72 hours. Thyroid Function Tests: No results for input(s): TSH, T4TOTAL, FREET4, T3FREE, THYROIDAB in the last 72 hours. Anemia Panel: No results for input(s): VITAMINB12, FOLATE, FERRITIN, TIBC, IRON, RETICCTPCT in the last 72 hours. Sepsis Labs: No results for input(s): PROCALCITON, LATICACIDVEN in the last 168 hours.  Recent Results (from the past 240 hour(s))  Respiratory Panel by RT PCR (Flu A&B, Covid) - Nasopharyngeal Swab     Status: None   Collection Time: 10/21/19 12:33 PM   Specimen: Nasopharyngeal Swab  Result Value Ref Range  Status   SARS Coronavirus 2 by RT PCR NEGATIVE NEGATIVE Final    Comment: (NOTE) SARS-CoV-2 target nucleic acids are NOT DETECTED. The SARS-CoV-2 RNA is generally detectable in upper respiratoy specimens during the acute phase of infection. The lowest concentration of SARS-CoV-2 viral copies this assay can detect is 131 copies/mL. A negative result does not preclude SARS-Cov-2 infection and should not be used as the sole basis for treatment or other patient management decisions. A negative result may occur with  improper specimen collection/handling, submission of specimen other than nasopharyngeal swab, presence of viral mutation(s) within the areas targeted by this assay, and inadequate number of viral copies (<131 copies/mL). A negative result must be combined with clinical observations, patient  history, and epidemiological information. The expected result is Negative. Fact Sheet for Patients:  PinkCheek.be Fact Sheet for Healthcare Providers:  GravelBags.it This test is not yet ap proved or cleared by the [place] FDA and  has been authorized for detection and/or diagnosis of SARS-CoV-2 by FDA under an Emergency Use Authorization (EUA). This EUA will remain  in effect (meaning this test can be used) for the duration of the COVID-19 declaration under Section 564(b)(1) of the Act, 21 U.S.C. section 360bbb-3(b)(1), unless the authorization is terminated or revoked sooner.    Influenza A by PCR NEGATIVE NEGATIVE Final   Influenza B by PCR NEGATIVE NEGATIVE Final    Comment: (NOTE) The Xpert Xpress SARS-CoV-2/FLU/RSV assay is intended as an aid in  the diagnosis of influenza from Nasopharyngeal swab specimens and  should not be used as a sole basis for treatment. Nasal washings and  aspirates are unacceptable for Xpert Xpress SARS-CoV-2/FLU/RSV  testing. Fact Sheet for  Patients: PinkCheek.be Fact Sheet for Healthcare Providers: GravelBags.it This test is not yet approved or cleared by the [place] FDA and  has been authorized for detection and/or diagnosis of SARS-CoV-2 by  FDA under an Emergency Use Authorization (EUA). This EUA will remain  in effect (meaning this test can be used) for the duration of the  Covid-19 declaration under Section 564(b)(1) of the Act, 21  U.S.C. section 360bbb-3(b)(1), unless the authorization is  terminated or revoked. Performed at [facility], [address] [place], [place] [address]   Body fluid culture (includes gram stain)     Status: None (Preliminary result)   Collection Time: 10/21/19  1:53 PM   Specimen: Pleural Fluid  Result Value Ref Range Status   Specimen Description   Final    PLEURAL Performed at [facility], [address] [place], [place] [address]    Special Requests   Final    NONE Performed at [facility], [address] [place], [place] [address]    Gram Stain   Final    RARE WBC PRESENT, PREDOMINANTLY PMN NO ORGANISMS SEEN    Culture   Final    NO GROWTH < 24 HOURS Performed at [facility], [place] [address], [place], [place] [address]    Report Status PENDING  Incomplete  CULTURE, BLOOD (ROUTINE X 2) w Reflex to ID Panel     Status: None (Preliminary result)   Collection Time: 10/21/19 10:04 PM   Specimen: BLOOD  Result Value Ref Range Status   Specimen Description BLOOD RIGHT ANTECUBITAL  Final   Special Requests   Final    BOTTLES DRAWN AEROBIC AND ANAEROBIC Blood Culture results may not be optimal due to an excessive volume of blood received in culture bottles   Culture   Final    NO GROWTH 2 DAYS Performed at [facility], [address], [place], [place] [address]    Report Status PENDING  Incomplete  CULTURE, BLOOD (ROUTINE X 2) w Reflex to ID Panel     Status: None  (Preliminary result)   Collection Time: 10/21/19 10:04 PM   Specimen: BLOOD  Result Value Ref Range Status   Specimen Description BLOOD RIGHT ANTECUBITAL  Final   Special Requests   Final    BOTTLES DRAWN AEROBIC AND ANAEROBIC Blood Culture results may not be optimal due to an excessive volume of blood received in culture bottles   Culture   Final    NO GROWTH 2 DAYS Performed  at [facility], [address] [place], [place] [address]    Report Status PENDING  Incomplete  MRSA PCR Screening     Status: None   Collection Time: 10/22/19 10:53 PM   Specimen: Nasal Mucosa; Nasopharyngeal  Result Value Ref Range Status   MRSA by PCR NEGATIVE NEGATIVE Final    Comment:        The GeneXpert MRSA Assay (FDA approved for NASAL specimens only), is one component of a comprehensive MRSA colonization surveillance program. It is not intended to diagnose MRSA infection nor to guide or monitor treatment for MRSA infections. Performed at [facility], [address] [place], [place] [address]          Radiology Studies: CT ABDOMEN PELVIS WO CONTRAST  Result Date: 10/22/2019 CLINICAL DATA:  Abdominal pain. Abdominal distension. Nausea since 3 p.m. today. EXAM: CT ABDOMEN AND PELVIS WITHOUT CONTRAST TECHNIQUE: Multidetector CT imaging of the abdomen and pelvis was performed following the standard protocol without IV contrast. COMPARISON:  Chest radiograph and chest CT of 1 day prior. Most recent abdominopelvic CT of 08/06/2019 FINDINGS: Lower chest: Left lower and posterior left upper lobe patchy consolidation. Left pleural drain/chest tube in place with small but incompletely imaged left-sided pneumothorax. This was detailed on yesterday's chest radiograph. Normal heart size. Multivessel coronary artery atherosclerosis. Hepatobiliary: Normal liver. Small gallstones. Borderline to mild gallbladder distension. No surrounding edema or biliary duct dilatation. Pancreas: Normal  pancreas for age, without duct dilatation or acute inflammation. Spleen: Normal in size, without focal abnormality. Adrenals/Urinary Tract: Normal adrenal glands. Mild renal cortical thinning bilaterally. No renal calculi or hydronephrosis. No hydroureter or ureteric calculi. Mild bladder distension. Stomach/Bowel: Mild gaseous distension of the stomach. Primarily gas-filled colon, including at up to 5.8 cm. The descending duodenum is relatively normal in caliber. There is a transverse duodenal diverticulum. Diffuse gas-filled loops of small bowel, on the order of maximally 2.3 cm. No pneumatosis or free intraperitoneal air. Vascular/Lymphatic: Aortic and branch vessel atherosclerosis. No abdominopelvic adenopathy. Reproductive: Normal prostate. Other: No significant free fluid. Tiny fat containing ventral abdominal wall hernia on 39/2. Musculoskeletal: Right acetabular sclerotic lesion is likely a bone island. Lumbosacral spondylosis with mild convex right lumbar spine curvature. IMPRESSION: 1. Mild gaseous distension of the stomach with gas-filled upper normal large and small bowel loops. Favor adynamic ileus. 2. Cholelithiasis with borderline gallbladder distension, but no specific evidence of acute cholecystitis. 3. Left base airspace disease, favoring pneumonia. Left-sided small bore chest tube in place with small pneumothorax, as on yesterday's chest radiograph. 4. Coronary artery atherosclerosis. Aortic Atherosclerosis (ICD10-I70.0). 5. Borderline bladder distension, without hydronephrosis. Electronically Signed   By: Abigail Miyamoto M.D.   On: 10/22/2019 17:56   DG Chest Port 1 View  Result Date: 10/23/2019 CLINICAL DATA:  Shortness of breath. Left-sided chest tube for 2 days. EXAM: PORTABLE CHEST 1 VIEW COMPARISON:  10/21/2019 FINDINGS: Nasogastric tube terminates at the body of the stomach. Left-sided chest tube remains in place. Midline trachea. Normal heart size. Numerous leads and wires project over the  chest. No pleural fluid. 5% left apical and inferolateral pneumothorax are not significantly changed. Persistent left base airspace disease. IMPRESSION: No significant change since 10/21/2019 Left chest tube in place with similar 5% left-sided pneumothorax. Similar left base airspace disease. Interval placement of nasogastric tube. Electronically Signed   By: Abigail Miyamoto M.D.   On: 10/23/2019 11:03   US Abdomen Limited RUQ  Result Date: 10/22/2019 CLINICAL DATA:  Abdominal pain, distended gallbladder  on CT EXAM: ULTRASOUND ABDOMEN LIMITED RIGHT UPPER QUADRANT COMPARISON:  CT 10/22/2019 FINDINGS: Gallbladder: Sludge within the gallbladder focal echogenic area along the anterior wall of the gallbladder measuring 1.9 cm. Increased gallbladder wall thickness at 8.6 mm. Negative sonographic Murphy. Small amount of pericholecystic fluid. Common bile duct: Diameter: 5.1 mm Liver: Limited visualization. Liver is echogenic. No gross focal hepatic abnormality. Portal vein is patent on color Doppler imaging with normal direction of blood flow towards the liver. Other: None. IMPRESSION: 1. Sludge within the gallbladder, with thickened gallbladder wall, pericholecystic fluid, but negative sonographic Murphy. Acalculous cholecystitis remains a concern, although liver disease and other edema forming states can also produce thickening of the gallbladder wall. Correlation with nuclear medicine hepatobiliary imaging may be considered. Additional finding of 1.9 cm echogenic mass along the anterior wall of the gallbladder, either representing tumefactive sludge or potentially large polyp. 2. Limited visualization of the liver secondary to patient mobility. Liver is echogenic consistent with steatosis and or hepatocellular disease. Electronically Signed   By: Donavan Foil M.D.   On: 10/22/2019 19:14        Scheduled Meds: . Chlorhexidine Gluconate Cloth  6 each Topical Daily  . dextromethorphan-guaiFENesin  1 tablet Oral  BID  . docusate  200 mg Oral BID  . finasteride  5 mg Oral Daily  . insulin aspart  0-5 Units Subcutaneous QHS  . insulin aspart  0-9 Units Subcutaneous TID WC  . linaclotide  145 mcg Oral q morning - 10a  . pantoprazole (PROTONIX) IV  40 mg Intravenous Q24H  . predniSONE  20 mg Oral Daily  . scopolamine  1 patch Transdermal Q72H  . sodium chloride flush  3 mL Intravenous Once  . Tofacitinib Citrate  5 mg Oral BID   Continuous Infusions:    LOS: 1 day    Time spent: 35 mins    Wyvonnia Dusky, MD [facility] Pager [phone]  If 7PM-7AM, please contact night-coverage [email or web address] Password [facility] 10/23/2019, 2:39 PM

## 2019-10-23 NOTE — Consult Note (Addendum)
GI Inpatient Consult Note  Reason for Consult: Hematemesis    Attending Requesting Consult: Dr. Eppie Gibson  History of Present Illness: David Cisneros is a 86 y.o. male seen for evaluation of hematemesis at the request of Dr. Eppie Gibson. Pt has a PMH of former smoker, HTN, DM, rheumatoid arthritis, chronic back pain, CKD Stage IIIa who presented from home to the ED on 12/30 for shortness of breath x 2 weeks. He was found to have large left-sided pleural effusion by chest x-ray and he is s/p left side chest tube. ED physician was called to bedside by ED nurse for concern about diaphoresis and hematemesis while pt was waiting for inpatient placement. ED physician noted tachycardia and diaphoresis and patient apparently was having dry heaves and small amount of bloody emesis. CT scan showed ileus with multiple dilated loops of bowel, no signs of perforation. He was started on IV saline, morphine, zofran, and IV Protonix. NG tube was also placed for decompression and to avoid aspiration. Hemoglobin on presentation to the ED was 10.4 and most recent hemoglobin this morning shows hemoglobin 9.8. His hemoglobin in October 2020 was 12.7. RUQ US performed yesterday showed sludge within the gallbladder with thickened gallbladder wall, pericholecystic fluid, but negative sonographic Murphy's sign. Acalculous cholecystitis cannot be excluded. There is finding of 1.9 cm echogenic mass along anterior wall of gallbladder, either representing tumefactive sludge or large polyp. LFTs have been within normal limits on admission.   Patient seen and examined this morning resting comfortably in bed. He reports there has been no recurrent episodes of emesis. He reports the emesis episode he had last night was bright to dark red in color and was small amount. He had NG tube placed last night and there is appx 120 cc output which appears brown in color. There is no frank bright red or black, tarry output. He reports he  does feel better than yesterday. He does still endorse generalized abdominal pain and distention which he describes as pressure sensations. He rates his pain currently 5/10 in severity. He denies any nausea. He reports he does suffer from chronic constipation and has been seen by [facility] previously where he was started on Linzess and Miralax. He reports normally has BM every 3 days only with assistance of laxatives.    Last Colonoscopy: 11/2010 - diverticulosis Last Endoscopy: 11/2010 - esophagitis    Past Medical History:  Past Medical History:  Diagnosis Date  . Cancer ([place])    skin   . Chronic back pain   . Collagen vascular disease ([facility])    RA  . Diabetes mellitus without complication ([place])   . Hypertension     Problem List: Patient Active Problem List   Diagnosis Date Noted  . Pleural effusion on left 10/21/2019  . Hyperkalemia 10/21/2019  . Pneumothorax on left 10/21/2019  . Rheumatoid arthritis ([place]) 10/21/2019  . Hypertension   . Chronic back pain   . CKD (chronic kidney disease), stage IIIa   . Leukocytosis     Past Surgical History: History reviewed. No pertinent surgical history.  Allergies: Allergies  Allergen Reactions  . Sulfa Antibiotics Rash    Home Medications: Medications Prior to Admission  Medication Sig Dispense Refill Last Dose  . finasteride (PROSCAR) 5 MG tablet Take 5 mg by mouth daily.   10/20/2019 at 0900  . glimepiride (AMARYL) 4 MG tablet Take 4 mg by mouth 2 (two) times daily.   10/20/2019 at 1700  . LINZESS 145 MCG  CAPS capsule Take 145 mcg by mouth every morning.   10/20/2019 at 0900  . metFORMIN (GLUCOPHAGE) 500 MG tablet Take 500 mg by mouth 2 (two) times daily.   10/20/2019 at 1700  . predniSONE (DELTASONE) 5 MG tablet Take 10 mg by mouth daily.   10/21/2019 at 0500  . XELJANZ 5 MG TABS Take 5 mg by mouth 2 (two) times daily.   10/21/2019 at [place] medication reconciliation was completed with the patient.   Scheduled  Inpatient Medications:   . Chlorhexidine Gluconate Cloth  6 each Topical Daily  . dextromethorphan-guaiFENesin  1 tablet Oral BID  . docusate  200 mg Oral BID  . finasteride  5 mg Oral Daily  . furosemide  40 mg Intravenous Once  . insulin aspart  0-5 Units Subcutaneous QHS  . insulin aspart  0-9 Units Subcutaneous TID WC  . insulin aspart  10 Units Subcutaneous Once  . linaclotide  145 mcg Oral q morning - 10a  . pantoprazole (PROTONIX) IV  40 mg Intravenous Q24H  . predniSONE  20 mg Oral Daily  . scopolamine  1 patch Transdermal Q72H  . sodium bicarbonate  100 mEq Intravenous Once  . sodium chloride flush  3 mL Intravenous Once  . Tofacitinib Citrate  5 mg Oral BID    Continuous Inpatient Infusions:   . calcium gluconate      PRN Inpatient Medications:  acetaminophen, albuterol, bisacodyl, hydrALAZINE, morphine injection, ondansetron (ZOFRAN) IV, oxyCODONE-acetaminophen, polyethylene glycol  Family History: family history includes Breast cancer in his sister; Heart attack in his brother.  The patient's family history is negative for inflammatory bowel disorders, GI malignancy, or solid organ transplantation.  Social History:   reports that he has never smoked. He has never used smokeless tobacco. He reports previous alcohol use. He reports previous drug use. The patient denies ETOH, tobacco, or drug use.   Review of Systems: Constitutional: Weight is stable.  Eyes: No changes in vision. ENT: No oral lesions, sore throat.  GI: see HPI.  Heme/Lymph: No easy bruising.  CV: No chest pain.  GU: No hematuria.  Integumentary: No rashes.  Neuro: No headaches.  Psych: No depression/anxiety.  Endocrine: No heat/cold intolerance.  Allergic/Immunologic: No urticaria.  Resp: No cough, SOB.  Musculoskeletal: No joint swelling.    Physical Examination: BP 132/72   Pulse 97   Temp 98.5 F (36.9 C) (Oral)   Resp (!) 21   Ht 5\' 5"  (1.651 m)   Wt 67.5 kg   SpO2 98%   BMI  24.76 kg/m  Gen: NAD, alert and oriented x 4 HEENT: PEERLA, EOMI. +NG tube in right nare.  Neck: supple, no JVD or thyromegaly Chest: CTA bilaterally, no wheezes, crackles, or other adventitious sounds CV: RRR, no m/g/c/r Abd: soft, mildly distended, hypoactive BS in all four quadrants; tender to deep palpation diffusely across all four quadrants, no HSM, guarding, ridigity, or rebound tenderness Ext: no edema, well perfused with 2+ pulses, Skin: no rash or lesions noted Lymph: no LAD  Data: Lab Results  Component Value Date   WBC 15.0 (H) 10/23/2019   HGB 9.8 (L) 10/23/2019   HCT 29.4 (L) 10/23/2019   MCV 88.6 10/23/2019   PLT 524 (H) 10/23/2019   Recent Labs  Lab 10/22/19 0436 10/22/19 1946 10/23/19 0430  HGB 10.8* 10.3* 9.8*   Lab Results  Component Value Date   NA 136 10/23/2019   K 5.5 (H) 10/23/2019   CL 99 10/23/2019  CO2 27 10/23/2019   BUN 42 (H) 10/23/2019   CREATININE 1.35 (H) 10/23/2019   Lab Results  Component Value Date   ALT 27 10/22/2019   AST 16 10/22/2019   ALKPHOS 59 10/22/2019   BILITOT 0.7 10/22/2019   Recent Labs  Lab 10/22/19 1946  APTT 33  INR 1.2   Assessment/Plan:  86 y/o Caucasian male with a PMH of former smoker, HTN, DM, rheumatoid arthritis, chronic back pain, CKD Stage IIIa admitted for left-sided pleural effusion s/p chest tube placement   1. Left-sided pleural effusion s/p chest tube placement - appx 3L of bloody fluid removed. Fluid analysis pending. Continue care per intensivist.  2. Ileus - etiology unclear. He has NG tube on low intermittent suction with 120 cc output non-bloody and non-billous  3. AKI on CKD IIIa - Creatinine trending down  4. Hyperkalemia - hospital team repleting today  5. Rheumatoid arthritis - on Xeljans and prednisone   6. Abnormal US - gallbladder sludge with thickening gallbladder wall, pericholecystic fluid. We will proceed with HIDA to rule out acalculous cholecystitis. 1.9 cm gallbladder  mass either tumefactive sludge vs large polyp   -Per nursing staff, one possible hematemesis episode last night. No recurrence of nausea or vomiting. He is hemodynamically stable overnight with no precipitous drop in his hemoglobin. DDx include self-limiting episode, Mallory-Weiss tear, PUD, gastritis, esophagitis, AVMs, duodenitis, swallowed blood, pulmonary etiology, etc -No indication for urgent endoscopy as there is no signs of acute blood loss anemia or hemodynamic instability -Continue acid suppression with IV Protonix -Agree with NG tube placement for gastric decompression and help prevent aspiration. Non-bloody and non-bilious output -Continue to monitor serial H&H and serial abdominal examinations -HIDA per above -Following   Thank you for the consult. Please call with questions or concerns.  Reeves Forth [facility] [phone] (Cell)

## 2019-10-23 NOTE — Significant Event (Signed)
BG 66>D50 12.5g given> post intervention recheck BG 136: MD aware

## 2019-10-23 NOTE — Progress Notes (Signed)
Patient alert and oriented and wanted to be repositioned. Safety mists and soft wrist restraints removed and patient is being monitored by staff. Continue to monitor.

## 2019-10-24 ENCOUNTER — Inpatient Hospital Stay: Payer: Medicare Other

## 2019-10-24 ENCOUNTER — Encounter: Payer: Self-pay | Admitting: Internal Medicine

## 2019-10-24 LAB — CBC
HCT: 29.8 % — ABNORMAL LOW (ref 39.0–52.0)
Hemoglobin: 9.9 g/dL — ABNORMAL LOW (ref 13.0–17.0)
MCH: 29.6 pg (ref 26.0–34.0)
MCHC: 33.2 g/dL (ref 30.0–36.0)
MCV: 89 fL (ref 80.0–100.0)
Platelets: 508 10*3/uL — ABNORMAL HIGH (ref 150–400)
RBC: 3.35 MIL/uL — ABNORMAL LOW (ref 4.22–5.81)
RDW: 12.5 % (ref 11.5–15.5)
WBC: 12.7 10*3/uL — ABNORMAL HIGH (ref 4.0–10.5)
nRBC: 0 % (ref 0.0–0.2)

## 2019-10-24 LAB — BASIC METABOLIC PANEL
Anion gap: 16 — ABNORMAL HIGH (ref 5–15)
BUN: 52 mg/dL — ABNORMAL HIGH (ref 8–23)
CO2: 29 mmol/L (ref 22–32)
Calcium: 8.4 mg/dL — ABNORMAL LOW (ref 8.9–10.3)
Chloride: 92 mmol/L — ABNORMAL LOW (ref 98–111)
Creatinine, Ser: 1.49 mg/dL — ABNORMAL HIGH (ref 0.61–1.24)
GFR calc Af Amer: 49 mL/min — ABNORMAL LOW (ref >60)
GFR calc non Af Amer: 42 mL/min — ABNORMAL LOW (ref >60)
Glucose, Bld: 175 mg/dL — ABNORMAL HIGH (ref 70–99)
Potassium: 5 mmol/L (ref 3.5–5.1)
Sodium: 137 mmol/L (ref 135–145)

## 2019-10-24 LAB — GLUCOSE, CAPILLARY
Glucose-Capillary: 163 mg/dL — ABNORMAL HIGH (ref 70–99)
Glucose-Capillary: 169 mg/dL — ABNORMAL HIGH (ref 70–99)
Glucose-Capillary: 156 mg/dL — ABNORMAL HIGH (ref 70–99)
Glucose-Capillary: 294 mg/dL — ABNORMAL HIGH (ref 70–99)
Glucose-Capillary: 231 mg/dL — ABNORMAL HIGH (ref 70–99)

## 2019-10-24 LAB — HEPATIC FUNCTION PANEL
ALT: 20 U/L (ref 0–44)
AST: 14 U/L — ABNORMAL LOW (ref 15–41)
Albumin: 2.7 g/dL — ABNORMAL LOW (ref 3.5–5.0)
Alkaline Phosphatase: 53 U/L (ref 38–126)
Bilirubin, Direct: <0.1 mg/dL (ref 0.0–0.2)
Total Bilirubin: 0.9 mg/dL (ref 0.3–1.2)
Total Protein: 6.1 g/dL — ABNORMAL LOW (ref 6.5–8.1)

## 2019-10-24 LAB — MAGNESIUM: Magnesium: 2.8 mg/dL — ABNORMAL HIGH (ref 1.7–2.4)

## 2019-10-24 LAB — PHOSPHORUS: Phosphorus: 5.2 mg/dL — ABNORMAL HIGH (ref 2.5–4.6)

## 2019-10-24 MED ORDER — TECHNETIUM TC 99M MEBROFENIN IV KIT
5.4650 | PACK | Freq: Once | INTRAVENOUS | Status: AC | PRN
  Administered 2019-10-24: 5.465 via INTRAVENOUS

## 2019-10-24 NOTE — Progress Notes (Addendum)
Nursing request tele sitter as patient awakened confused, agitated with ilines and tubes, and managed to get NGT out.  Previously wrist restraints ordered for tube/line protection but were removed by RN around 2 am per documentation. Nurse reports patient is passing flatus, no BM. No reports of vomiting. Will leave NGT out for now. Tele sitter ordered.  Wrist restraints still necessary given inability to redirect with verbal direction and chest tube in place

## 2019-10-24 NOTE — Progress Notes (Signed)
PROGRESS NOTE    David Cisneros  [id number] DOB: 12/14/1930 DOA: 10/21/2019 PCP: Valera Castle, MD       Assessment & Plan:   Principal Problem:   Pleural effusion on left Active Problems:   Hyperkalemia   Hypertension   Chronic back pain   CKD (chronic kidney disease), stage IIIa   Leukocytosis   Pneumothorax on left   Rheumatoid arthritis (HCC)   Left pleural effusion: Etiology unclear, likely secondary to complication from RA as per intensvist. S/p left chest tube placement. Per EDP, approximately 3 L of bloody fluid were removed. Pleural fluid analysis pending still. Morphine prn for pain. Pulmon following and recs apprec   Pneumothorax on left: small. S/p left chest tube & draining bloody fluid. W/ air leak present and will continue w/ chest tube as per surgery. Managed as per pulmon.   Hyperkalemia: KCl is WNL but upper limits of normal. Will continue to monitor   Ileus: etiology unclear. Pt pulled out NG tube and the NG tube was left out. Evidently pt is passing gas but no bowel movement yet. Continue on colace & dulcolax. Possible bloody bilious vomiting on evening on 10/22/19.  H&H are stable. GI following and recs apprec  Abd pain: etiology unclear, ileus vs acalculous cholecystitis. US gallbladder shows Sludge within the gallbladder, with thickened gallbladder wall, pericholecystic fluid, but negative sonographic Murphy. Acalculous cholecystitis remains a concern, although liver disease and other edema forming states can also produce thickening of the gallbladder wall. Correlation with nuclear medicine hepatobiliary imaging may be considered. HIDA scan today and if neg can start clears and advance diet as tolerated as per surg  Possible hematemesis: H&H are stable. No need for a transfusion at this time. GI recs apprec   Rheumatoid arthritis: at home on Xeljanz and prednisone & will continue these meds but an increased dose of prednisone from 10 to 20mg   daily. Will hold prednisone while pt is NPO and continue on IV solumedrol   AKI on CKDIIIa: baseline creatinine 1.33. Cr is trending back up today. Will continue to monitor   Thrombocytosis: likely reactive. Will continue to monitor   Normocytic anemia: no need for a transfusion at this time. Will continue to monitor   Hypertension: not taking medication for blood pressure at home. Hydralazine prn.  Chronic back pain: morphine prn for pain   Leukocytosis: likely secondary to steroid use. Will continue to monitor       DVT prophylaxis: SCDs secondary to bloody pleural fluid output Code Status: DNR Family Communication:  Disposition Plan:    Consultants:   Pulmon   Procedures:  S/p chest tube placement on 10/21/19; s/p NG tube placement on 10/22/19 & pt pulled out NG tube 10/24/19   Antimicrobials:    Subjective: Pt c/o abd pain still   Objective: Vitals:   10/24/19 0500 10/24/19 0600 10/24/19 0700 10/24/19 0800  BP: 131/67 (!) 146/69 140/79 (!) 156/78  Pulse: 87 85 91 (!) 106  Resp: 17 17 10  (!) 27  Temp:   97.9 F (36.6 C)   TempSrc:   Oral   SpO2: 100% 100% 100% 98%  Weight:      Height:        Intake/Output Summary (Last 24 hours) at 10/24/2019 0839 Last data filed at 10/24/2019 0700 Gross per 24 hour  Intake 140.12 ml  Output 1740 ml  Net -1599.88 ml   Filed Weights   10/21/19 1050 10/22/19 2241  Weight: 72.6 kg 67.5 kg  Examination:  General exam: Appears calm but uncomfortable  Respiratory system: diminished breath sounds b/l. Chest tube in place still draining bloody fluid Cardiovascular system: S1 & S2 +. No rubs, gallops or clicks.  Gastrointestinal system: Abdomen has mild distention, soft and tenderness to palpation.  bowel sounds present.  Central nervous system: Alert and oriented. Moves all 4 extremities. Psychiatry: Agitated and frustrated       Data Reviewed: I have personally reviewed following labs and imaging  studies  CBC: Recent Labs  Lab 10/21/19 1054 10/22/19 0436 10/22/19 1946 10/23/19 0430 10/24/19 0559  WBC 13.8* 15.9* 15.5* 15.0* 12.7*  NEUTROABS  --   --  14.5*  --   --   HGB 10.4* 10.8* 10.3* 9.8* 9.9*  HCT 32.9* 33.7* 32.6* 29.4* 29.8*  MCV 93.7 92.6 92.9 88.6 89.0  PLT 613* 600* 571* 524* 123XX123*   Basic Metabolic Panel: Recent Labs  Lab 10/21/19 1054 10/22/19 0436 10/22/19 1035 10/22/19 1946 10/23/19 0430 10/23/19 1138 10/24/19 0559  NA 136 137  --  133* 136  --  137  K 5.4* 5.2* 5.1 5.2* 5.5* 4.2 5.0  CL 96* 99  --  95* 99  --  92*  CO2 25 25  --  27 27  --  29  GLUCOSE 195* 168*  --  223* 149*  --  175*  BUN 26* 34*  --  41* 42*  --  52*  CREATININE 1.44* 1.45*  --  1.53* 1.35*  --  1.49*  CALCIUM 8.7* 8.2*  --  8.0* 8.0*  --  8.4*  MG  --   --   --   --   --   --  2.8*  PHOS  --   --   --   --   --   --  5.2*   GFR: Estimated Creatinine Clearance: 31 mL/min (A) (by C-G formula based on SCr of 1.49 mg/dL (H)). Liver Function Tests: Recent Labs  Lab 10/22/19 1946  AST 16  ALT 27  ALKPHOS 59  BILITOT 0.7  PROT 6.2*  ALBUMIN 2.7*   Recent Labs  Lab 10/22/19 1946  LIPASE 77*   No results for input(s): AMMONIA in the last 168 hours. Coagulation Profile: Recent Labs  Lab 10/21/19 1353 10/22/19 1946  INR 1.1 1.2   Cardiac Enzymes: No results for input(s): CKTOTAL, CKMB, CKMBINDEX, TROPONINI in the last 168 hours. BNP (last 3 results) No results for input(s): PROBNP in the last 8760 hours. HbA1C: Recent Labs    10/21/19 2205  HGBA1C 6.9*   CBG: Recent Labs  Lab 10/23/19 1637 10/23/19 1942 10/23/19 2111 10/24/19 0412 10/24/19 0743  GLUCAP 136* 165* 189* 163* 169*   Lipid Profile: No results for input(s): CHOL, HDL, LDLCALC, TRIG, CHOLHDL, LDLDIRECT in the last 72 hours. Thyroid Function Tests: No results for input(s): TSH, T4TOTAL, FREET4, T3FREE, THYROIDAB in the last 72 hours. Anemia Panel: No results for input(s): VITAMINB12,  FOLATE, FERRITIN, TIBC, IRON, RETICCTPCT in the last 72 hours. Sepsis Labs: No results for input(s): PROCALCITON, LATICACIDVEN in the last 168 hours.  Recent Results (from the past 240 hour(s))  Respiratory Panel by RT PCR (Flu A&B, Covid) - Nasopharyngeal Swab     Status: None   Collection Time: 10/21/19 12:33 PM   Specimen: Nasopharyngeal Swab  Result Value Ref Range Status   SARS Coronavirus 2 by RT PCR NEGATIVE NEGATIVE Final    Comment: (NOTE) SARS-CoV-2 target nucleic acids are NOT DETECTED. The SARS-CoV-2 RNA  is generally detectable in upper respiratoy specimens during the acute phase of infection. The lowest concentration of SARS-CoV-2 viral copies this assay can detect is 131 copies/mL. A negative result does not preclude SARS-Cov-2 infection and should not be used as the sole basis for treatment or other patient management decisions. A negative result may occur with  improper specimen collection/handling, submission of specimen other than nasopharyngeal swab, presence of viral mutation(s) within the areas targeted by this assay, and inadequate number of viral copies (<131 copies/mL). A negative result must be combined with clinical observations, patient history, and epidemiological information. The expected result is Negative. Fact Sheet for Patients:  PinkCheek.be Fact Sheet for Healthcare Providers:  GravelBags.it This test is not yet ap proved or cleared by the [place] FDA and  has been authorized for detection and/or diagnosis of SARS-CoV-2 by FDA under an Emergency Use Authorization (EUA). This EUA will remain  in effect (meaning this test can be used) for the duration of the COVID-19 declaration under Section 564(b)(1) of the Act, 21 U.S.C. section 360bbb-3(b)(1), unless the authorization is terminated or revoked sooner.    Influenza A by PCR NEGATIVE NEGATIVE Final   Influenza B by PCR NEGATIVE  NEGATIVE Final    Comment: (NOTE) The Xpert Xpress SARS-CoV-2/FLU/RSV assay is intended as an aid in  the diagnosis of influenza from Nasopharyngeal swab specimens and  should not be used as a sole basis for treatment. Nasal washings and  aspirates are unacceptable for Xpert Xpress SARS-CoV-2/FLU/RSV  testing. Fact Sheet for Patients: PinkCheek.be Fact Sheet for Healthcare Providers: GravelBags.it This test is not yet approved or cleared by the [place] FDA and  has been authorized for detection and/or diagnosis of SARS-CoV-2 by  FDA under an Emergency Use Authorization (EUA). This EUA will remain  in effect (meaning this test can be used) for the duration of the  Covid-19 declaration under Section 564(b)(1) of the Act, 21  U.S.C. section 360bbb-3(b)(1), unless the authorization is  terminated or revoked. Performed at [facility], [address] [place], [place] [address]   Body fluid culture (includes gram stain)     Status: None (Preliminary result)   Collection Time: 10/21/19  1:53 PM   Specimen: Pleural Fluid  Result Value Ref Range Status   Specimen Description   Final    PLEURAL Performed at [facility], [address] [place], [place] [address]    Special Requests   Final    NONE Performed at [facility], [address] [place], [place] [address]    Gram Stain   Final    RARE WBC PRESENT, PREDOMINANTLY PMN NO ORGANISMS SEEN    Culture   Final    NO GROWTH < 24 HOURS Performed at [facility], [place] [address], [place], [place] [address]    Report Status PENDING  Incomplete  CULTURE, BLOOD (ROUTINE X 2) w Reflex to ID Panel     Status: None (Preliminary result)   Collection Time: 10/21/19 10:04 PM   Specimen: BLOOD  Result Value Ref Range Status   Specimen Description BLOOD RIGHT ANTECUBITAL  Final   Special Requests   Final    BOTTLES DRAWN AEROBIC AND ANAEROBIC  Blood Culture results may not be optimal due to an excessive volume of blood received in culture bottles   Culture   Final    NO GROWTH 3 DAYS Performed at [facility], [address], [place], [place] [address]  Report Status PENDING  Incomplete  CULTURE, BLOOD (ROUTINE X 2) w Reflex to ID Panel     Status: None (Preliminary result)   Collection Time: 10/21/19 10:04 PM   Specimen: BLOOD  Result Value Ref Range Status   Specimen Description BLOOD RIGHT ANTECUBITAL  Final   Special Requests   Final    BOTTLES DRAWN AEROBIC AND ANAEROBIC Blood Culture results may not be optimal due to an excessive volume of blood received in culture bottles   Culture   Final    NO GROWTH 3 DAYS Performed at [facility], [address] [place], [place] [address]    Report Status PENDING  Incomplete  MRSA PCR Screening     Status: None   Collection Time: 10/22/19 10:53 PM   Specimen: Nasal Mucosa; Nasopharyngeal  Result Value Ref Range Status   MRSA by PCR NEGATIVE NEGATIVE Final    Comment:        The GeneXpert MRSA Assay (FDA approved for NASAL specimens only), is one component of a comprehensive MRSA colonization surveillance program. It is not intended to diagnose MRSA infection nor to guide or monitor treatment for MRSA infections. Performed at [facility], [address], [place], [place] [address]          Radiology Studies: CT ABDOMEN PELVIS WO CONTRAST  Result Date: 10/22/2019 CLINICAL DATA:  Abdominal pain. Abdominal distension. Nausea since 3 p.m. today. EXAM: CT ABDOMEN AND PELVIS WITHOUT CONTRAST TECHNIQUE: Multidetector CT imaging of the abdomen and pelvis was performed following the standard protocol without IV contrast. COMPARISON:  Chest radiograph and chest CT of 1 day prior. Most recent abdominopelvic CT of 08/06/2019 FINDINGS: Lower chest: Left lower and posterior left upper lobe patchy consolidation. Left pleural drain/chest tube in  place with small but incompletely imaged left-sided pneumothorax. This was detailed on yesterday's chest radiograph. Normal heart size. Multivessel coronary artery atherosclerosis. Hepatobiliary: Normal liver. Small gallstones. Borderline to mild gallbladder distension. No surrounding edema or biliary duct dilatation. Pancreas: Normal pancreas for age, without duct dilatation or acute inflammation. Spleen: Normal in size, without focal abnormality. Adrenals/Urinary Tract: Normal adrenal glands. Mild renal cortical thinning bilaterally. No renal calculi or hydronephrosis. No hydroureter or ureteric calculi. Mild bladder distension. Stomach/Bowel: Mild gaseous distension of the stomach. Primarily gas-filled colon, including at up to 5.8 cm. The descending duodenum is relatively normal in caliber. There is a transverse duodenal diverticulum. Diffuse gas-filled loops of small bowel, on the order of maximally 2.3 cm. No pneumatosis or free intraperitoneal air. Vascular/Lymphatic: Aortic and branch vessel atherosclerosis. No abdominopelvic adenopathy. Reproductive: Normal prostate. Other: No significant free fluid. Tiny fat containing ventral abdominal wall hernia on 39/2. Musculoskeletal: Right acetabular sclerotic lesion is likely a bone island. Lumbosacral spondylosis with mild convex right lumbar spine curvature. IMPRESSION: 1. Mild gaseous distension of the stomach with gas-filled upper normal large and small bowel loops. Favor adynamic ileus. 2. Cholelithiasis with borderline gallbladder distension, but no specific evidence of acute cholecystitis. 3. Left base airspace disease, favoring pneumonia. Left-sided small bore chest tube in place with small pneumothorax, as on yesterday's chest radiograph. 4. Coronary artery atherosclerosis. Aortic Atherosclerosis (ICD10-I70.0). 5. Borderline bladder distension, without hydronephrosis. Electronically Signed   By: Abigail Miyamoto M.D.   On: 10/22/2019 17:56   DG Chest Port 1  View  Result Date: 10/23/2019 CLINICAL DATA:  Shortness of breath. Left-sided chest tube for 2 days. EXAM: PORTABLE CHEST 1 VIEW COMPARISON:  10/21/2019 FINDINGS: Nasogastric tube terminates at the body  of the stomach. Left-sided chest tube remains in place. Midline trachea. Normal heart size. Numerous leads and wires project over the chest. No pleural fluid. 5% left apical and inferolateral pneumothorax are not significantly changed. Persistent left base airspace disease. IMPRESSION: No significant change since 10/21/2019 Left chest tube in place with similar 5% left-sided pneumothorax. Similar left base airspace disease. Interval placement of nasogastric tube. Electronically Signed   By: Abigail Miyamoto M.D.   On: 10/23/2019 11:03   US Abdomen Limited RUQ  Result Date: 10/22/2019 CLINICAL DATA:  Abdominal pain, distended gallbladder on CT EXAM: ULTRASOUND ABDOMEN LIMITED RIGHT UPPER QUADRANT COMPARISON:  CT 10/22/2019 FINDINGS: Gallbladder: Sludge within the gallbladder focal echogenic area along the anterior wall of the gallbladder measuring 1.9 cm. Increased gallbladder wall thickness at 8.6 mm. Negative sonographic Murphy. Small amount of pericholecystic fluid. Common bile duct: Diameter: 5.1 mm Liver: Limited visualization. Liver is echogenic. No gross focal hepatic abnormality. Portal vein is patent on color Doppler imaging with normal direction of blood flow towards the liver. Other: None. IMPRESSION: 1. Sludge within the gallbladder, with thickened gallbladder wall, pericholecystic fluid, but negative sonographic Murphy. Acalculous cholecystitis remains a concern, although liver disease and other edema forming states can also produce thickening of the gallbladder wall. Correlation with nuclear medicine hepatobiliary imaging may be considered. Additional finding of 1.9 cm echogenic mass along the anterior wall of the gallbladder, either representing tumefactive sludge or potentially large polyp. 2.  Limited visualization of the liver secondary to patient mobility. Liver is echogenic consistent with steatosis and or hepatocellular disease. Electronically Signed   By: Donavan Foil M.D.   On: 10/22/2019 19:14        Scheduled Meds: . Chlorhexidine Gluconate Cloth  6 each Topical Daily  . dextromethorphan-guaiFENesin  1 tablet Oral BID  . docusate  200 mg Oral BID  . finasteride  5 mg Oral Daily  . insulin aspart  0-5 Units Subcutaneous QHS  . insulin aspart  0-9 Units Subcutaneous TID WC  . linaclotide  145 mcg Oral q morning - 10a  . methylPREDNISolone (SOLU-MEDROL) injection  16 mg Intravenous Q24H  . pantoprazole (PROTONIX) IV  40 mg Intravenous Q24H  . predniSONE  20 mg Oral Daily  . scopolamine  1 patch Transdermal Q72H  . sodium chloride flush  3 mL Intravenous Once  . Tofacitinib Citrate  5 mg Oral BID   Continuous Infusions:    LOS: 2 days    Time spent: 32 mins    Wyvonnia Dusky, MD [facility] Pager [phone]  If 7PM-7AM, please contact night-coverage [email or web address] Password [facility] [place] 10/24/2019, 8:39 AM

## 2019-10-24 NOTE — Progress Notes (Signed)
  [facility] GI Progress Note  Patient reportedly sundowned and pulled out NG tube last evening. No episodes of interval vomiting. No BM's yet. Pleural effusion improving with CT drainage. No melena or hemetemesis.  VSS  Gen: NAD. Appears comfortable.  HEENT: [place]/AT. PERRLA. Normal external ear exam.  Chest: Low breath sounds on left. No wheezes. Chest tube draining blood fluid on Left chest.   CV: RR nl S1, S2. No gallops.  Abd: soft, nt, nd. BS+  Ext: no edema. Pulses 2+  Neuro: Alert and oriented. Judgement appears normal. Nonfocal.  Hemoglobin: 9.9 (unchanged since 10/22/2018).  Impression:  1. Hemetemesis - hemodynamically insignificant, not recurrent. 2. Ileus  - bowel rest, NG suction. NG pulled by patient. 3. Rheumatoid pleural effusion - Per Dr. Lanney Gins with CT drainage. 4. DNR status.  Recommendations:  1. Serial KUB to assess need for resuming NG decompression. May leave NG tube out for now. 2. Continue acid suppression. 3. GI will sign off for now. Call back if we can help. [phone].   Case discussed with Dr. Lanney Gins, Intensivist on duty today.   Thank you for the opportunity to care for David Cisneros.  Please call me if any questions or pertinent updates.  Robet Leu, M.D. ABIM Diplomate in Gastroenterology [place]

## 2019-10-24 NOTE — Consult Note (Signed)
Date of Consultation:  10/24/2019  Requesting Physician:  Ottie Glazier, MD  Reason for Consultation:  Left pleural effusion, abdominal pain.  History of Present Illness: David Cisneros is a 86 y.o. male admitted on 12/30 with worsening shortness of breath.  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  He was seeing a pain specialist for injection and had abnormal vital signs which prompted his going to the ED.  In the ED, CXR and CT chest showed a large left sided pleural effusion.  Chest tube was placed by Dr. Joni Fears and 3+ liters of fluid was removed.  He was admitted to stepdown unit for further monitoring.    On 12/31 he was having some nausea and distention and CT abd/pelvis was obtained which showed a distended stomach and some bowel distention concerning for ileus picture, as well as a distended gallbladder.  U/S showed a gallbladder wall of 8.6 mm with trace pericholecystic fluid.  He also had episode of nausea and hematemesis and GI has been following.  His Hgb has overall been stable at 9.9 today and 9.8 yesterday.  Total bilirubin is normal, AST 14, ALT 20, alk phos 53.  Overnight, he pulled his NG tube and was agitated.  His chest tube has been working well, but he has an airleak with expiration.  CXR does not show a pleural effusion anymore.  Today, patient denies any nausea or emesis and thinks he's having flatus.  He reports he has a history of constipation and takes Linzess and had to use potassium citrate prior to admission for severe constipation.  Chest tube remains in place with airleak.  Past Medical History: Past Medical History:  Diagnosis Date  . Cancer ([place])    skin   . Chronic back pain   . Collagen vascular disease ([facility])    RA  . Diabetes mellitus without complication ([place])   . Hypertension      Past Surgical History: History reviewed. No pertinent surgical history.  Home  Medications: Prior to Admission medications   Medication Sig Start Date End Date Taking? Authorizing Provider  finasteride (PROSCAR) 5 MG tablet Take 5 mg by mouth daily. 09/11/19  Yes [provider]  glimepiride (AMARYL) 4 MG tablet Take 4 mg by mouth 2 (two) times daily. 09/21/19  Yes [provider]  LINZESS 145 MCG CAPS capsule Take 145 mcg by mouth every morning. 10/01/19  Yes [provider]  metFORMIN (GLUCOPHAGE) 500 MG tablet Take 500 mg by mouth 2 (two) times daily. 09/28/19  Yes [provider]  predniSONE (DELTASONE) 5 MG tablet Take 10 mg by mouth daily. 10/09/19  Yes [provider]  XELJANZ 5 MG TABS Take 5 mg by mouth 2 (two) times daily. 10/06/19  Yes [provider]    Allergies: Allergies  Allergen Reactions  . Sulfa Antibiotics Rash    Social History:  reports that he has never smoked. He has never used smokeless tobacco. He reports previous alcohol use. He reports previous drug use.   Family History: Family History  Problem Relation Age of Onset  . Breast cancer Sister   . Heart attack Brother     Review of Systems: Review of Systems  Constitutional: Negative for chills and fever.  HENT: Negative for hearing loss.   Eyes: Negative for blurred vision.  Respiratory: Positive for shortness of breath.   Cardiovascular: Negative for chest pain.  Gastrointestinal: Positive for  abdominal pain, constipation, nausea and vomiting. Negative for diarrhea.  Genitourinary: Negative for dysuria.  Musculoskeletal: Negative for myalgias.  Skin: Negative for rash.  Neurological: Negative for dizziness.  Psychiatric/Behavioral: Negative for depression.    Physical Exam BP (!) 156/78   Pulse (!) 106   Temp 97.9 F (36.6 C) (Oral)   Resp (!) 27   Ht _0  (1.651 m)   Wt 67.5 kg   SpO2 98%   BMI 24.76 kg/m  CONSTITUTIONAL: No acute distress HEENT:  Normocephalic, atraumatic, extraocular motion intact. NECK:  Trachea is midline, and there is no jugular venous distension. RESPIRATORY:  Left sided chest tube in place with serosanguinous fluid in pleuravac.  There is a small air leak on expiration only.  All tubing is well secured.  Dressing is clean, dry.  Patient is tender at the insertion site. CARDIOVASCULAR: Heart is regular without murmurs, gallops, or rubs. GI: The abdomen is soft, non-distended, with no tenderness in RUQ or epigastric area.  MUSCULOSKELETAL:  Normal muscle strength and tone in all four extremities.  No peripheral edema or cyanosis. SKIN: Skin turgor is normal. There are no pathologic skin lesions.  NEUROLOGIC:  Motor and sensation is grossly normal.  Cranial nerves are grossly intact. PSYCH:  Alert and oriented to person, place and time. Affect is normal.  Laboratory Analysis: Results for orders placed or performed during the hospital encounter of 10/21/19 (from the past 24 hour(s))  Glucose, capillary     Status: Abnormal   Collection Time: 10/23/19  3:45 PM  Result Value Ref Range   Glucose-Capillary 66 (L) 70 - 99 mg/dL  Glucose, capillary     Status: Abnormal   Collection Time: 10/23/19  4:37 PM  Result Value Ref Range   Glucose-Capillary 136 (H) 70 - 99 mg/dL  Glucose, capillary     Status: Abnormal   Collection Time: 10/23/19  7:42 PM  Result Value Ref Range   Glucose-Capillary 165 (H) 70 - 99 mg/dL  Glucose, capillary     Status: Abnormal   Collection Time: 10/23/19  9:11 PM  Result Value Ref Range   Glucose-Capillary 189 (H) 70 - 99 mg/dL  Glucose, capillary     Status: Abnormal   Collection Time: 10/24/19  4:12 AM  Result Value Ref Range   Glucose-Capillary 163 (H) 70 - 99 mg/dL  CBC     Status: Abnormal   Collection Time: 10/24/19  5:59 AM  Result Value Ref Range   WBC 12.7 (H) 4.0 - 10.5 K/uL   RBC 3.35 (L) 4.22 - 5.81 MIL/uL   Hemoglobin 9.9 (L) 13.0 - 17.0 g/dL   HCT 29.8 (L) 39.0 - 52.0 %   MCV 89.0 80.0 - 100.0 fL   MCH 29.6 26.0 - 34.0 pg    MCHC 33.2 30.0 - 36.0 g/dL   RDW 12.5 11.5 - 15.5 %   Platelets 508 (H) 150 - 400 K/uL   nRBC 0.0 0.0 - 0.2 %  Basic metabolic panel     Status: Abnormal   Collection Time: 10/24/19  5:59 AM  Result Value Ref Range   Sodium 137 135 - 145 mmol/L   Potassium 5.0 3.5 - 5.1 mmol/L   Chloride 92 (L) 98 - 111 mmol/L   CO2 29 22 - 32 mmol/L   Glucose, Bld 175 (H) 70 - 99 mg/dL   BUN 52 (H) 8 - 23 mg/dL   Creatinine, Ser 1.49 (H) 0.61 - 1.24 mg/dL   Calcium 8.4 (L) 8.9 -  10.3 mg/dL   GFR calc non Af Amer 42 (L) >60 mL/min   GFR calc Af Amer 49 (L) >60 mL/min   Anion gap 16 (H) 5 - 15  Magnesium     Status: Abnormal   Collection Time: 10/24/19  5:59 AM  Result Value Ref Range   Magnesium 2.8 (H) 1.7 - 2.4 mg/dL  Phosphorus     Status: Abnormal   Collection Time: 10/24/19  5:59 AM  Result Value Ref Range   Phosphorus 5.2 (H) 2.5 - 4.6 mg/dL  Hepatic function panel     Status: Abnormal   Collection Time: 10/24/19  5:59 AM  Result Value Ref Range   Total Protein 6.1 (L) 6.5 - 8.1 g/dL   Albumin 2.7 (L) 3.5 - 5.0 g/dL   AST 14 (L) 15 - 41 U/L   ALT 20 0 - 44 U/L   Alkaline Phosphatase 53 38 - 126 U/L   Total Bilirubin 0.9 0.3 - 1.2 mg/dL   Bilirubin, Direct <0.1 0.0 - 0.2 mg/dL   Indirect Bilirubin NOT CALCULATED 0.3 - 0.9 mg/dL  Glucose, capillary     Status: Abnormal   Collection Time: 10/24/19  7:43 AM  Result Value Ref Range   Glucose-Capillary 169 (H) 70 - 99 mg/dL   Comment 1 Notify RN   Glucose, capillary     Status: Abnormal   Collection Time: 10/24/19 11:45 AM  Result Value Ref Range   Glucose-Capillary 156 (H) 70 - 99 mg/dL    Imaging: DG Chest Port 1 View  Result Date: 10/24/2019 CLINICAL DATA:  Pneumothorax, left side EXAM: PORTABLE CHEST 1 VIEW COMPARISON:  Chest radiograph 10/23/2019 at 10:40 a.m. FINDINGS: Stable cardiomediastinal contours. Interval removal of a nasogastric tube. Left-sided chest tube remains in place. Stable appearance of a small left-sided  pneumothorax. Persistent heterogeneous opacities at the left lung base. The right lung is clear. No significant pleural effusion. IMPRESSION: Interval removal of a nasogastric tube. Otherwise stable chest with small left-sided pneumothorax and left basilar airspace disease. Electronically Signed   By: Audie Pinto M.D.   On: 10/24/2019 09:31    Assessment and Plan: This is a 86 y.o. male with left pleural effusion and abdominal pain.  --Etiology of both is unclear at this point.  Cytology is pending on fluid, and LDH is elevated on fluid.  Currently has an airleak but chest tube appears to be working well and is in good position on CXR.  Has small residual pneumothorax on CXR.  The airleak could be related to possible lung injury during chest tube placement, as he did not have a PTX on CT on admission.  Recommend continuing chest tube to suction, serial CXR.  Will continue to follow over weekend and discuss with Dr. Genevive Bi on Monday. --Regarding his abdominal pain, U/S appears abnormal but clinical exam is unremarkable today.  Has HIDA scan pending for today.  Patient is otherwise asking when he can eat.  If HIDA scan is negative, can start clears and advance diet as tolerated.  Face-to-face time spent with the patient and care providers was 55 minutes, with more than 50% of the time spent counseling, educating, and coordinating care of the patient.     Melvyn Neth, MD [facility] Pg:  [phone]

## 2019-10-24 NOTE — Progress Notes (Signed)
Checked on patient at 0200 and documented that restraints were off and patient was asleep. Within 8 minutes, the bed alarm alerted nurse that the patient was awake. Patient pulled out his NG, blood pressure cuff, pulse ox, cardiac monitor leads, and had one leg out of the bed and started swinging at staff and verbally abusive to staff saying he was going to kill them, and yelling for "Daddy".  Very confused and unable to be redirected. Charge RN, primary nurse and another nurse had to put the patient back into the safety mitts and soft wrist restraints.  Continue to monitor.

## 2019-10-24 NOTE — Progress Notes (Signed)
Pulmonary Medicine          Date: 10/24/2019,   MRN# [id number] David Cisneros [birth year]     AdmissionWeight: 72.6 kg                 CurrentWeight: 67.5 kg   Referring physician: Dr Jimmye Norman.    CHIEF COMPLAINT:   Large left pleural effusion, chest tube management, pneumothorax.    SUBJECTIVE    Patient is clinically improved.  Had approximately 200 cc serosanguineous return from chest tube.  Hemodynamically stable.  Overnight patient was apparently agitated with some sundowning, unintentionally removed NG tube and EKG leads overnight during intermittent confusion overnight.  Slight agitation this morning due to pain around chest tube insertion site.  Patient with 1:1 sitter   PAST MEDICAL HISTORY   Past Medical History:  Diagnosis Date  . Cancer ([place])    skin   . Chronic back pain   . Collagen vascular disease ([facility])    RA  . Diabetes mellitus without complication ([place])   . Hypertension      SURGICAL HISTORY   History reviewed. No pertinent surgical history.   FAMILY HISTORY   Family History  Problem Relation Age of Onset  . Breast cancer Sister   . Heart attack Brother      SOCIAL HISTORY   Social History   Tobacco Use  . Smoking status: Never Smoker  . Smokeless tobacco: Never Used  Substance Use Topics  . Alcohol use: Not Currently  . Drug use: Not Currently     MEDICATIONS    Home Medication:    Current Medication:  Current Facility-Administered Medications:  .  acetaminophen (TYLENOL) tablet 650 mg, 650 mg, Oral, Q6H PRN, Ivor Costa, MD .  albuterol (VENTOLIN HFA) 108 (90 Base) MCG/ACT inhaler 2 puff, 2 puff, Inhalation, Q4H PRN, Ivor Costa, MD .  bisacodyl (DULCOLAX) EC tablet 5 mg, 5 mg, Oral, Daily PRN, Wyvonnia Dusky, MD .  Chlorhexidine Gluconate Cloth 2 % PADS 6 each, 6 each, Topical, Daily, Wyvonnia Dusky, MD, 6 each at 10/23/19 0818 .  dextromethorphan-guaiFENesin (MUCINEX DM) 30-600 MG per 12 hr  tablet 1 tablet, 1 tablet, Oral, BID, Ivor Costa, MD, 1 tablet at 10/22/19 0900 .  docusate (COLACE) 50 MG/5ML liquid 200 mg, 200 mg, Oral, BID, Wyvonnia Dusky, MD, 200 mg at 10/22/19 1218 .  finasteride (PROSCAR) tablet 5 mg, 5 mg, Oral, Daily, Ivor Costa, MD, 5 mg at 10/22/19 0900 .  hydrALAZINE (APRESOLINE) injection 20 mg, 20 mg, Intravenous, Q6H PRN, Wyvonnia Dusky, MD .  insulin aspart (novoLOG) injection 0-5 Units, 0-5 Units, Subcutaneous, QHS, Niu, Xilin, MD .  insulin aspart (novoLOG) injection 0-9 Units, 0-9 Units, Subcutaneous, TID WC, Ivor Costa, MD, 2 Units at 10/24/19 [phone] .  linaclotide (LINZESS) capsule 145 mcg, 145 mcg, Oral, q morning - 10a, Ivor Costa, MD, 145 mcg at 10/22/19 0901 .  methylPREDNISolone sodium succinate (SOLU-MEDROL) 40 mg/mL injection 16 mg, 16 mg, Intravenous, Q24H, Wyvonnia Dusky, MD, 16 mg at 10/23/19 1558 .  morphine 2 MG/ML injection 2 mg, 2 mg, Intravenous, Q2H PRN, Wyvonnia Dusky, MD, 2 mg at 10/24/19 0752 .  morphine 4 MG/ML injection 4 mg, 4 mg, Intravenous, Q3H PRN, Wyvonnia Dusky, MD .  ondansetron [facility]) injection 4 mg, 4 mg, Intravenous, Q8H PRN, Ivor Costa, MD, 4 mg at 10/23/19 0912 .  pantoprazole (PROTONIX) injection 40 mg, 40 mg, Intravenous, Q24H, Wyvonnia Dusky, MD,  40 mg at 10/24/19 0752 .  polyethylene glycol (MIRALAX / GLYCOLAX) packet 17 g, 17 g, Oral, Daily PRN, Ivor Costa, MD .  predniSONE (DELTASONE) tablet 20 mg, 20 mg, Oral, Daily, Wyvonnia Dusky, MD, 20 mg at 10/22/19 0901 .  scopolamine (TRANSDERM-SCOP) 1 MG/3DAYS 1.5 mg, 1 patch, Transdermal, Q72H, Wyvonnia Dusky, MD, 1.5 mg at 10/22/19 1940 .  sodium chloride flush (NS) 0.9 % injection 3 mL, 3 mL, Intravenous, Once, Ivor Costa, MD .  Tofacitinib Citrate TABS 5 mg, 5 mg, Oral, BID, Ivor Costa, MD    ALLERGIES   Sulfa antibiotics     REVIEW OF SYSTEMS    Review of Systems:  Gen:  Denies  fever, sweats, chills weigh loss  HEENT:  Denies blurred vision, double vision, ear pain, eye pain, hearing loss, nose bleeds, sore throat Cardiac:  No dizziness, chest pain or heaviness, chest tightness,edema Resp:   Denies cough or sputum porduction, shortness of breath,wheezing, hemoptysis,  Gi: Denies swallowing difficulty, stomach pain, nausea or vomiting, diarrhea, constipation, bowel incontinence Gu:  Denies bladder incontinence, burning urine Ext:   Denies Joint pain, stiffness or swelling Skin: Denies  skin rash, easy bruising or bleeding or hives Endoc:  Denies polyuria, polydipsia , polyphagia or weight change Psych:   Denies depression, insomnia or hallucinations   Other:  All other systems negative   VS: BP (!) 156/78   Pulse (!) 106   Temp 97.9 F (36.6 C) (Oral)   Resp (!) 27   Ht 5\' 5"  (1.651 m)   Wt 67.5 kg   SpO2 98%   BMI 24.76 kg/m      PHYSICAL EXAM    GENERAL:NAD, no fevers, chills, no weakness no fatigue HEAD: Normocephalic, atraumatic.  EYES: Pupils equal, round, reactive to light. Extraocular muscles intact. No scleral icterus.  MOUTH: Moist mucosal membrane. Dentition intact. No abscess noted.  EAR, NOSE, THROAT: Clear without exudates. No external lesions.  NECK: Supple. No thyromegaly. No nodules. No JVD.  PULMONARY: Decreased bs at left, left chest tube with bloody drainage and + air leak CARDIOVASCULAR: S1 and S2. Regular rate and rhythm. No murmurs, rubs, or gallops. No edema. Pedal pulses 2+ bilaterally.  GASTROINTESTINAL: Soft, nontender, nondistended. No masses. Positive bowel sounds. No hepatosplenomegaly.  MUSCULOSKELETAL: No swelling, clubbing, or edema. Range of motion full in all extremities.  NEUROLOGIC: Cranial nerves II through XII are intact. No gross focal neurological deficits. Sensation intact. Reflexes intact.  SKIN: No ulceration, lesions, rashes, or cyanosis. Skin warm and dry. Turgor intact.  PSYCHIATRIC: Mood, affect within normal limits. The patient is awake, alert  and oriented x 3. Insight, judgment intact.        IMAGING    CT ABDOMEN PELVIS WO CONTRAST  Result Date: 10/22/2019 CLINICAL DATA:  Abdominal pain. Abdominal distension. Nausea since 3 p.m. today. EXAM: CT ABDOMEN AND PELVIS WITHOUT CONTRAST TECHNIQUE: Multidetector CT imaging of the abdomen and pelvis was performed following the standard protocol without IV contrast. COMPARISON:  Chest radiograph and chest CT of 1 day prior. Most recent abdominopelvic CT of 08/06/2019 FINDINGS: Lower chest: Left lower and posterior left upper lobe patchy consolidation. Left pleural drain/chest tube in place with small but incompletely imaged left-sided pneumothorax. This was detailed on yesterday's chest radiograph. Normal heart size. Multivessel coronary artery atherosclerosis. Hepatobiliary: Normal liver. Small gallstones. Borderline to mild gallbladder distension. No surrounding edema or biliary duct dilatation. Pancreas: Normal pancreas for age, without duct dilatation or acute inflammation. Spleen: Normal in  size, without focal abnormality. Adrenals/Urinary Tract: Normal adrenal glands. Mild renal cortical thinning bilaterally. No renal calculi or hydronephrosis. No hydroureter or ureteric calculi. Mild bladder distension. Stomach/Bowel: Mild gaseous distension of the stomach. Primarily gas-filled colon, including at up to 5.8 cm. The descending duodenum is relatively normal in caliber. There is a transverse duodenal diverticulum. Diffuse gas-filled loops of small bowel, on the order of maximally 2.3 cm. No pneumatosis or free intraperitoneal air. Vascular/Lymphatic: Aortic and branch vessel atherosclerosis. No abdominopelvic adenopathy. Reproductive: Normal prostate. Other: No significant free fluid. Tiny fat containing ventral abdominal wall hernia on 39/2. Musculoskeletal: Right acetabular sclerotic lesion is likely a bone island. Lumbosacral spondylosis with mild convex right lumbar spine curvature. IMPRESSION:  1. Mild gaseous distension of the stomach with gas-filled upper normal large and small bowel loops. Favor adynamic ileus. 2. Cholelithiasis with borderline gallbladder distension, but no specific evidence of acute cholecystitis. 3. Left base airspace disease, favoring pneumonia. Left-sided small bore chest tube in place with small pneumothorax, as on yesterday's chest radiograph. 4. Coronary artery atherosclerosis. Aortic Atherosclerosis (ICD10-I70.0). 5. Borderline bladder distension, without hydronephrosis. Electronically Signed   By: Abigail Miyamoto M.D.   On: 10/22/2019 17:56   DG Chest 2 View  Result Date: 10/21/2019 CLINICAL DATA:  86 year old male with shortness of breath. Patient is not able to raise his left arm. EXAM: CHEST - 2 VIEW COMPARISON:  Chest radiograph dated 10/02/2014. FINDINGS: There is a large left pleural effusion with compressive atelectasis of the majority of the left lung. A small aerated portion of the left upper lobe remains. The right lung is clear. No pneumothorax. The cardiac borders are silhouetted. Atherosclerotic calcification of the aorta. No acute osseous pathology. IMPRESSION: Large left pleural effusion with compressive atelectasis of the majority of the left lung. Pneumonia is not excluded. Electronically Signed   By: Anner Crete M.D.   On: 10/21/2019 11:24   CT Chest Wo Contrast  Result Date: 10/21/2019 CLINICAL DATA:  Abnormal x-ray EXAM: CT CHEST WITHOUT CONTRAST TECHNIQUE: Multidetector CT imaging of the chest was performed following the standard protocol without IV contrast. COMPARISON:  None. FINDINGS: Cardiovascular: Normal heart size. Coronary artery calcification. No significant pericardial effusion. Calcified plaque along the thoracic aorta. Mediastinum/Nodes: Rightward mediastinal shift. No mediastinal or axillary adenopathy. Unremarkable thyroid. Lungs/Pleura: Large left pleural effusion with near complete atelectasis of the left lung. There is partial  aeration of the left upper lobe. Right lung remains aerated with chronic interstitial changes at the lung base. Upper Abdomen: No acute abnormality. Musculoskeletal: No chest wall mass or significant osseous abnormality. IMPRESSION: Large left pleural effusion with compressive atelectasis of the majority of the left lung. Aortic Atherosclerosis (ICD10-I70.0). Electronically Signed   By: Macy Mis M.D.   On: 10/21/2019 12:35   DG Chest Port 1 View  Result Date: 10/23/2019 CLINICAL DATA:  Shortness of breath. Left-sided chest tube for 2 days. EXAM: PORTABLE CHEST 1 VIEW COMPARISON:  10/21/2019 FINDINGS: Nasogastric tube terminates at the body of the stomach. Left-sided chest tube remains in place. Midline trachea. Normal heart size. Numerous leads and wires project over the chest. No pleural fluid. 5% left apical and inferolateral pneumothorax are not significantly changed. Persistent left base airspace disease. IMPRESSION: No significant change since 10/21/2019 Left chest tube in place with similar 5% left-sided pneumothorax. Similar left base airspace disease. Interval placement of nasogastric tube. Electronically Signed   By: Abigail Miyamoto M.D.   On: 10/23/2019 11:03   DG Chest Portable 1 View  Result Date: 10/21/2019 CLINICAL DATA:  Left-sided chest tube placement. EXAM: PORTABLE CHEST 1 VIEW COMPARISON:  CT chest and chest x-ray from same day. FINDINGS: New left-sided chest tube at the lung base. Resolved left pleural effusion. Small left pneumothorax. Hazy density throughout the left lung. The right lung is clear. Normal heart size. No acute osseous abnormality. IMPRESSION: 1. Interval placement of a left-sided chest tube with resolved left pleural effusion. 2. Small left pneumothorax, presumably ex vacuo due to incomplete re-expansion of the left lung. 3. Hazy density throughout the left lung could reflect atelectasis and/or re-expansion pulmonary edema. Electronically Signed   By: Titus Dubin  M.D.   On: 10/21/2019 14:11   US Abdomen Limited RUQ  Result Date: 10/22/2019 CLINICAL DATA:  Abdominal pain, distended gallbladder on CT EXAM: ULTRASOUND ABDOMEN LIMITED RIGHT UPPER QUADRANT COMPARISON:  CT 10/22/2019 FINDINGS: Gallbladder: Sludge within the gallbladder focal echogenic area along the anterior wall of the gallbladder measuring 1.9 cm. Increased gallbladder wall thickness at 8.6 mm. Negative sonographic Murphy. Small amount of pericholecystic fluid. Common bile duct: Diameter: 5.1 mm Liver: Limited visualization. Liver is echogenic. No gross focal hepatic abnormality. Portal vein is patent on color Doppler imaging with normal direction of blood flow towards the liver. Other: None. IMPRESSION: 1. Sludge within the gallbladder, with thickened gallbladder wall, pericholecystic fluid, but negative sonographic Murphy. Acalculous cholecystitis remains a concern, although liver disease and other edema forming states can also produce thickening of the gallbladder wall. Correlation with nuclear medicine hepatobiliary imaging may be considered. Additional finding of 1.9 cm echogenic mass along the anterior wall of the gallbladder, either representing tumefactive sludge or potentially large polyp. 2. Limited visualization of the liver secondary to patient mobility. Liver is echogenic consistent with steatosis and or hepatocellular disease. Electronically Signed   By: Donavan Foil M.D.   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   Left chest tube management    - decreased bloodly output overnight <20cc    - + Airleak on forced expiration    - good tidal motion wave   -Status post general surgery evaluation-cleared for transfer out of stepdown to medical floor   Left Chest pneumothorax   -possible -ex vacuo due to endobronchial debri - will need to reimage chest   -repeat CXR    Acute blood loss anemia due to Upper GI bleed   - patient had hematemesis which has resolved   - NG tube to LIS with non bloody - bilious return    -IV protonix    Rheumatoid Arthritis Continue Xeljans and prednisone as per primary rheumatologist     Thank you Dr Jimmye Norman for allowing me to participate in the care of this patient.   Patient/Family are satisfied with care plan and all questions have been answered.  This document was prepared using Dragon voice recognition software and may include unintentional dictation errors.  Ottie Glazier, M.D.  Div[facility] of [place]

## 2019-10-25 LAB — CBC
HCT: 32 % — ABNORMAL LOW (ref 39.0–52.0)
Hemoglobin: 10 g/dL — ABNORMAL LOW (ref 13.0–17.0)
MCH: 29 pg (ref 26.0–34.0)
MCHC: 31.3 g/dL (ref 30.0–36.0)
MCV: 92.8 fL (ref 80.0–100.0)
Platelets: 525 10*3/uL — ABNORMAL HIGH (ref 150–400)
RBC: 3.45 MIL/uL — ABNORMAL LOW (ref 4.22–5.81)
RDW: 12.4 % (ref 11.5–15.5)
WBC: 11.7 10*3/uL — ABNORMAL HIGH (ref 4.0–10.5)
nRBC: 0 % (ref 0.0–0.2)

## 2019-10-25 LAB — BASIC METABOLIC PANEL
Anion gap: 13 (ref 5–15)
BUN: 49 mg/dL — ABNORMAL HIGH (ref 8–23)
CO2: 31 mmol/L (ref 22–32)
Calcium: 8.5 mg/dL — ABNORMAL LOW (ref 8.9–10.3)
Chloride: 92 mmol/L — ABNORMAL LOW (ref 98–111)
Creatinine, Ser: 1.17 mg/dL (ref 0.61–1.24)
GFR calc Af Amer: >60 mL/min (ref >60)
GFR calc non Af Amer: 56 mL/min — ABNORMAL LOW (ref >60)
Glucose, Bld: 184 mg/dL — ABNORMAL HIGH (ref 70–99)
Potassium: 4.5 mmol/L (ref 3.5–5.1)
Sodium: 136 mmol/L (ref 135–145)

## 2019-10-25 LAB — GLUCOSE, CAPILLARY
Glucose-Capillary: 176 mg/dL — ABNORMAL HIGH (ref 70–99)
Glucose-Capillary: 236 mg/dL — ABNORMAL HIGH (ref 70–99)
Glucose-Capillary: 255 mg/dL — ABNORMAL HIGH (ref 70–99)
Glucose-Capillary: 274 mg/dL — ABNORMAL HIGH (ref 70–99)

## 2019-10-25 LAB — PHOSPHORUS: Phosphorus: 3.6 mg/dL (ref 2.5–4.6)

## 2019-10-25 LAB — MAGNESIUM: Magnesium: 2.6 mg/dL — ABNORMAL HIGH (ref 1.7–2.4)

## 2019-10-25 MED ORDER — MAGNESIUM HYDROXIDE 400 MG/5ML PO SUSP
15.0000 mL | Freq: Every day | ORAL | Status: DC
  Administered 2019-10-25 – 2019-10-28 (×4): 15 mL via ORAL
  Filled 2019-10-25 (×4): qty 30

## 2019-10-25 MED ORDER — ENSURE MAX PROTEIN PO LIQD
11.0000 [oz_av] | Freq: Two times a day (BID) | ORAL | Status: DC
  Administered 2019-10-26 – 2019-11-03 (×14): 11 [oz_av] via ORAL
  Filled 2019-10-25 (×18): qty 330

## 2019-10-25 NOTE — Progress Notes (Signed)
PROGRESS NOTE    David Cisneros  [id number] DOB: Jan 24, 1931 DOA: 10/21/2019 PCP: Valera Castle, MD       Assessment & Plan:   Principal Problem:   Pleural effusion on left Active Problems:   Hyperkalemia   Hypertension   Chronic back pain   CKD (chronic kidney disease), stage IIIa   Leukocytosis   Pneumothorax on left   Rheumatoid arthritis (HCC)   Left pleural effusion: Etiology unclear, likely secondary to complication from RA as per intensvist. S/p left chest tube placement. Per EDP, approximately 3 L of bloody fluid were removed. Pleural fluid analysis pending still. Pleural fluid cx NGTD. Morphine prn for pain. Pulmon following and recs apprec   Pneumothorax on left: small. S/p left chest tube & draining bloody fluid. W/ air leak present and will continue w/ chest tube. Managed as per pulmon.   Hyperkalemia: resolved. WNL today. Will continue to monitor   Ileus: etiology unclear. Pt pulled out NG tube 10/24/19 and the NG tube was left out. Evidently pt is passing gas but no bowel movement yet still. Continue on colace, dulcolax, & start milk of Mg. Possible bloody bilious vomiting on evening on 10/22/19.  H&H are stable. GI signed off  Abd pain: etiology unclear, ileus vs acalculous cholecystitis. US gallbladder shows Sludge within the gallbladder, with thickened gallbladder wall, pericholecystic fluid, but negative sonographic Murphy. Acalculous cholecystitis remains a concern, although liver disease and other edema forming states can also produce thickening of the gallbladder wall. Correlation with nuclear medicine hepatobiliary imaging may be considered. HIDA shows no evidence of acute cholecystitis & decreased EF of 28%, so pt can f/u outpatient w/ Dr. Inda Merlin.  Possible hematemesis: H&H are stable. No need for a transfusion at this time. GI recs apprec   Rheumatoid arthritis: at home on Xeljanz and prednisone & will continue these meds but an increased dose of  prednisone from 10 to 20mg  daily. Continue on IV solumedrol and hold prednisone   AKI on CKDIIIa: baseline creatinine 1.33. Cr is trending down today. Will continue to monitor   Thrombocytosis: likely reactive. Will continue to monitor   Normocytic anemia: no need for a transfusion at this time. Will continue to monitor   Hypertension: not taking medication for blood pressure at home. Hydralazine prn.  Chronic back pain: morphine prn for pain   Leukocytosis: likely secondary to steroid use. Will continue to monitor       DVT prophylaxis: SCDs secondary to bloody pleural fluid output Code Status: DNR Family Communication:  Disposition Plan:    Consultants:   Pulmon   Procedures:  S/p chest tube placement on 10/21/19; s/p NG tube placement on 10/22/19 & pt pulled out NG tube 10/24/19   Antimicrobials:    Subjective: Pt c/o malaise   Objective: Vitals:   10/24/19 1709 10/24/19 2114 10/25/19 0613 10/25/19 0622  BP: (!) 160/69 129/73 (!) 174/83 (!) 157/82  Pulse: (!) 117 87 99 93  Resp: 20 20 20    Temp: 98.6 F (37 C) 98.3 F (36.8 C) 98 F (36.7 C)   TempSrc: Oral Oral Oral   SpO2: 92% 95% 94% 94%  Weight:      Height:        Intake/Output Summary (Last 24 hours) at 10/25/2019 0822 Last data filed at 10/25/2019 0745 Gross per 24 hour  Intake 120 ml  Output 850 ml  Net -730 ml   Filed Weights   10/21/19 1050 10/22/19 2241  Weight: 72.6 kg 67.5  kg    Examination:  General exam: Appears calm  & comfortable  Respiratory system: decreased breath sounds b/l. Chest tube in place still draining bloody fluid Cardiovascular system: S1 & S2 +. No rubs, gallops or clicks.  Gastrointestinal system: Abdomen has mild distention, soft and tenderness to palpation.  Hypoactive bowel sounds present.  Central nervous system: Alert and oriented. Moves all 4 extremities. Psychiatry: Normal mood and affect today       Data Reviewed: I have personally reviewed following  labs and imaging studies  CBC: Recent Labs  Lab 10/22/19 0436 10/22/19 1946 10/23/19 0430 10/24/19 0559 10/25/19 0542  WBC 15.9* 15.5* 15.0* 12.7* 11.7*  NEUTROABS  --  14.5*  --   --   --   HGB 10.8* 10.3* 9.8* 9.9* 10.0*  HCT 33.7* 32.6* 29.4* 29.8* 32.0*  MCV 92.6 92.9 88.6 89.0 92.8  PLT 600* 571* 524* 508* [id number]*   Basic Metabolic Panel: Recent Labs  Lab 10/22/19 0436 10/22/19 1946 10/23/19 0430 10/23/19 1138 10/24/19 0559 10/25/19 0542  NA 137 133* 136  --  137 136  K 5.2* 5.2* 5.5* 4.2 5.0 4.5  CL 99 95* 99  --  92* 92*  CO2 25 27 27   --  29 31  GLUCOSE 168* 223* 149*  --  175* 184*  BUN 34* 41* 42*  --  52* 49*  CREATININE 1.45* 1.53* 1.35*  --  1.49* 1.17  CALCIUM 8.2* 8.0* 8.0*  --  8.4* 8.5*  MG  --   --   --   --  2.8* 2.6*  PHOS  --   --   --   --  5.2* 3.6   GFR: Estimated Creatinine Clearance: 39.4 mL/min (by C-G formula based on SCr of 1.17 mg/dL). Liver Function Tests: Recent Labs  Lab 10/22/19 1946 10/24/19 0559  AST 16 14*  ALT 27 20  ALKPHOS 59 53  BILITOT 0.7 0.9  PROT 6.2* 6.1*  ALBUMIN 2.7* 2.7*   Recent Labs  Lab 10/22/19 1946  LIPASE 77*   No results for input(s): AMMONIA in the last 168 hours. Coagulation Profile: Recent Labs  Lab 10/21/19 1353 10/22/19 1946  INR 1.1 1.2   Cardiac Enzymes: No results for input(s): CKTOTAL, CKMB, CKMBINDEX, TROPONINI in the last 168 hours. BNP (last 3 results) No results for input(s): PROBNP in the last 8760 hours. HbA1C: No results for input(s): HGBA1C in the last 72 hours. CBG: Recent Labs  Lab 10/24/19 0743 10/24/19 1145 10/24/19 1738 10/24/19 2155 10/25/19 0749  GLUCAP 169* 156* 294* 231* 176*   Lipid Profile: No results for input(s): CHOL, HDL, LDLCALC, TRIG, CHOLHDL, LDLDIRECT in the last 72 hours. Thyroid Function Tests: No results for input(s): TSH, T4TOTAL, FREET4, T3FREE, THYROIDAB in the last 72 hours. Anemia Panel: No results for input(s): VITAMINB12, FOLATE,  FERRITIN, TIBC, IRON, RETICCTPCT in the last 72 hours. Sepsis Labs: No results for input(s): PROCALCITON, LATICACIDVEN in the last 168 hours.  Recent Results (from the past 240 hour(s))  Respiratory Panel by RT PCR (Flu A&B, Covid) - Nasopharyngeal Swab     Status: None   Collection Time: 10/21/19 12:33 PM   Specimen: Nasopharyngeal Swab  Result Value Ref Range Status   SARS Coronavirus 2 by RT PCR NEGATIVE NEGATIVE Final    Comment: (NOTE) SARS-CoV-2 target nucleic acids are NOT DETECTED. The SARS-CoV-2 RNA is generally detectable in upper respiratoy specimens during the acute phase of infection. The lowest concentration of SARS-CoV-2 viral copies this assay  can detect is 131 copies/mL. A negative result does not preclude SARS-Cov-2 infection and should not be used as the sole basis for treatment or other patient management decisions. A negative result may occur with  improper specimen collection/handling, submission of specimen other than nasopharyngeal swab, presence of viral mutation(s) within the areas targeted by this assay, and inadequate number of viral copies (<131 copies/mL). A negative result must be combined with clinical observations, patient history, and epidemiological information. The expected result is Negative. Fact Sheet for Patients:  PinkCheek.be Fact Sheet for Healthcare Providers:  GravelBags.it This test is not yet ap proved or cleared by the [place] FDA and  has been authorized for detection and/or diagnosis of SARS-CoV-2 by FDA under an Emergency Use Authorization (EUA). This EUA will remain  in effect (meaning this test can be used) for the duration of the COVID-19 declaration under Section 564(b)(1) of the Act, 21 U.S.C. section 360bbb-3(b)(1), unless the authorization is terminated or revoked sooner.    Influenza A by PCR NEGATIVE NEGATIVE Final   Influenza B by PCR NEGATIVE NEGATIVE Final     Comment: (NOTE) The Xpert Xpress SARS-CoV-2/FLU/RSV assay is intended as an aid in  the diagnosis of influenza from Nasopharyngeal swab specimens and  should not be used as a sole basis for treatment. Nasal washings and  aspirates are unacceptable for Xpert Xpress SARS-CoV-2/FLU/RSV  testing. Fact Sheet for Patients: PinkCheek.be Fact Sheet for Healthcare Providers: GravelBags.it This test is not yet approved or cleared by the [place] FDA and  has been authorized for detection and/or diagnosis of SARS-CoV-2 by  FDA under an Emergency Use Authorization (EUA). This EUA will remain  in effect (meaning this test can be used) for the duration of the  Covid-19 declaration under Section 564(b)(1) of the Act, 21  U.S.C. section 360bbb-3(b)(1), unless the authorization is  terminated or revoked. Performed at [facility], [address] [place], [place] [address]   Body fluid culture (includes gram stain)     Status: None (Preliminary result)   Collection Time: 10/21/19  1:53 PM   Specimen: Pleural Fluid  Result Value Ref Range Status   Specimen Description   Final    PLEURAL Performed at [facility], [address] [place], [place] [address]    Special Requests   Final    NONE Performed at [facility], [address] [place], [place] [address]    Gram Stain   Final    RARE WBC PRESENT, PREDOMINANTLY PMN NO ORGANISMS SEEN    Culture   Final    NO GROWTH 2 DAYS Performed at [facility], [place] [address] [place], [place] [address]    Report Status PENDING  Incomplete  CULTURE, BLOOD (ROUTINE X 2) w Reflex to ID Panel     Status: None (Preliminary result)   Collection Time: 10/21/19 10:04 PM   Specimen: BLOOD  Result Value Ref Range Status   Specimen Description BLOOD RIGHT ANTECUBITAL  Final   Special Requests   Final    BOTTLES DRAWN AEROBIC AND ANAEROBIC Blood Culture  results may not be optimal due to an excessive volume of blood received in culture bottles   Culture   Final    NO GROWTH 4 DAYS Performed at [facility], [address] [place], [place] [address]    Report Status PENDING  Incomplete  CULTURE, BLOOD (ROUTINE X 2) w Reflex to ID Panel     Status: None (Preliminary  result)   Collection Time: 10/21/19 10:04 PM   Specimen: BLOOD  Result Value Ref Range Status   Specimen Description BLOOD RIGHT ANTECUBITAL  Final   Special Requests   Final    BOTTLES DRAWN AEROBIC AND ANAEROBIC Blood Culture results may not be optimal due to an excessive volume of blood received in culture bottles   Culture   Final    NO GROWTH 4 DAYS Performed at [facility], [address] [place], [place] [address]    Report Status PENDING  Incomplete  MRSA PCR Screening     Status: None   Collection Time: 10/22/19 10:53 PM   Specimen: Nasal Mucosa; Nasopharyngeal  Result Value Ref Range Status   MRSA by PCR NEGATIVE NEGATIVE Final    Comment:        The GeneXpert MRSA Assay (FDA approved for NASAL specimens only), is one component of a comprehensive MRSA colonization surveillance program. It is not intended to diagnose MRSA infection nor to guide or monitor treatment for MRSA infections. Performed at [facility], [address], [place], [place] [address]          Radiology Studies: NM Hepato W/EjeCT Fract  Result Date: 10/24/2019 CLINICAL DATA:  Abdominal pain. Abnormal right upper quadrant ultrasound. EXAM: NUCLEAR MEDICINE HEPATOBILIARY IMAGING TECHNIQUE: Sequential images of the abdomen were obtained out to 60 minutes following intravenous administration of radiopharmaceutical. RADIOPHARMACEUTICALS:  5.465 mCi Tc-53m  Choletec IV COMPARISON:  Ultrasound 11/21/2018, CT 11/21/2018 FINDINGS: Prompt uptake and biliary excretion of activity by the liver is seen. Gallbladder activity is visualized, consistent with patency of  cystic duct. Biliary activity passes into small bowel, consistent with patent common bile duct. Gallbladder ejection fraction 28%. IMPRESSION: 1. Patent cystic and common bile ducts. No evidence of acute cholecystitis. 2. Decreased gallbladder ejection fraction of 28% suggesting gallbladder dysfunction. Electronically Signed   By: Davina Poke D.O.   On: 10/24/2019 17:11   DG Chest Port 1 View  Result Date: 10/24/2019 CLINICAL DATA:  Pneumothorax, left side EXAM: PORTABLE CHEST 1 VIEW COMPARISON:  Chest radiograph 10/23/2019 at 10:40 a.m. FINDINGS: Stable cardiomediastinal contours. Interval removal of a nasogastric tube. Left-sided chest tube remains in place. Stable appearance of a small left-sided pneumothorax. Persistent heterogeneous opacities at the left lung base. The right lung is clear. No significant pleural effusion. IMPRESSION: Interval removal of a nasogastric tube. Otherwise stable chest with small left-sided pneumothorax and left basilar airspace disease. Electronically Signed   By: Audie Pinto M.D.   On: 10/24/2019 09:31   DG Chest Port 1 View  Result Date: 10/23/2019 CLINICAL DATA:  Shortness of breath. Left-sided chest tube for 2 days. EXAM: PORTABLE CHEST 1 VIEW COMPARISON:  10/21/2019 FINDINGS: Nasogastric tube terminates at the body of the stomach. Left-sided chest tube remains in place. Midline trachea. Normal heart size. Numerous leads and wires project over the chest. No pleural fluid. 5% left apical and inferolateral pneumothorax are not significantly changed. Persistent left base airspace disease. IMPRESSION: No significant change since 10/21/2019 Left chest tube in place with similar 5% left-sided pneumothorax. Similar left base airspace disease. Interval placement of nasogastric tube. Electronically Signed   By: Abigail Miyamoto M.D.   On: 10/23/2019 11:03        Scheduled Meds: . Chlorhexidine Gluconate Cloth  6 each Topical Daily  . dextromethorphan-guaiFENesin  1  tablet Oral BID  . docusate  200 mg Oral BID  . finasteride  5 mg Oral Daily  . insulin aspart  0-5 Units Subcutaneous QHS  . insulin aspart  0-9 Units Subcutaneous TID WC  . linaclotide  145 mcg Oral q morning - 10a  . methylPREDNISolone (SOLU-MEDROL) injection  16 mg Intravenous Q24H  . pantoprazole (PROTONIX) IV  40 mg Intravenous Q24H  . predniSONE  20 mg Oral Daily  . scopolamine  1 patch Transdermal Q72H  . sodium chloride flush  3 mL Intravenous Once  . Tofacitinib Citrate  5 mg Oral BID   Continuous Infusions:    LOS: 3 days    Time spent: 30 mins    Wyvonnia Dusky, MD [facility] Pager [phone]  If 7PM-7AM, please contact night-coverage [email or web address] Password [email or web address] 10/25/2019, 8:22 AM

## 2019-10-25 NOTE — Consult Note (Signed)
Follow up of Consultation:  10/25/2019   Reason for Consultation:  Left pleural effusion, abdominal pain.  History of Present Illness: David Cisneros is a 86 y.o. male admitted on 12/30 with worsening shortness of breath.  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  He was seeing a pain specialist for injection and had abnormal vital signs which prompted his going to the ED.  In the ED, CXR and CT chest showed a large left sided pleural effusion.  Chest tube was placed by Dr. Joni Fears and 3+ liters of fluid was removed.  He was admitted to stepdown unit for further monitoring.    On 12/31 he was having some nausea and distention and CT abd/pelvis was obtained which showed a distended stomach and some bowel distention concerning for ileus picture, as well as a distended gallbladder.  U/S showed a gallbladder wall of 8.6 mm with trace pericholecystic fluid.  He also had episode of nausea and hematemesis and GI has been following.  His Hgb has overall been stable at 9.9 today and 9.8 yesterday.  Total bilirubin is normal, AST 14, ALT 20, alk phos 53.  Overnight, he pulled his NG tube and was agitated.  His chest tube has been working well, but he has an airleak with expiration.  CXR does not show a pleural effusion anymore. Chest tube remains in place with airleak.   Past Medical History: Past Medical History:  Diagnosis Date  . Cancer ([place])    skin   . Chronic back pain   . Collagen vascular disease (HCC)    RA  . Diabetes mellitus without complication ([place])   . Hypertension      Past Surgical History: History reviewed. No pertinent surgical history.  Home Medications: Prior to Admission medications   Medication Sig Start Date End Date Taking? Authorizing Provider  finasteride (PROSCAR) 5 MG tablet Take 5 mg by mouth daily. 09/11/19  Yes [provider]  glimepiride (AMARYL) 4 MG tablet Take 4 mg by mouth 2  (two) times daily. 09/21/19  Yes [provider]  LINZESS 145 MCG CAPS capsule Take 145 mcg by mouth every morning. 10/01/19  Yes [provider]  metFORMIN (GLUCOPHAGE) 500 MG tablet Take 500 mg by mouth 2 (two) times daily. 09/28/19  Yes [provider]  predniSONE (DELTASONE) 5 MG tablet Take 10 mg by mouth daily. 10/09/19  Yes [provider]  XELJANZ 5 MG TABS Take 5 mg by mouth 2 (two) times daily. 10/06/19  Yes [provider]    Allergies: Allergies  Allergen Reactions  . Sulfa Antibiotics Rash    Social History:  reports that he has never smoked. He has never used smokeless tobacco. He reports previous alcohol use. He reports previous drug use.   Family History: Family History  Problem Relation Age of Onset  . Breast cancer Sister   . Heart attack Brother     Review of Systems: Review of Systems  Constitutional: Negative for chills and fever.  HENT: Negative for hearing loss.   Eyes: Negative for blurred vision.  Respiratory: Positive for shortness of breath.   Cardiovascular: Negative for chest pain.  Gastrointestinal: Positive for abdominal pain, constipation, nausea and vomiting. Negative for diarrhea.  Genitourinary: Negative for dysuria.  Musculoskeletal: Negative for myalgias.  Skin: Negative for rash.  Neurological: Negative for dizziness.  Psychiatric/Behavioral: Negative for depression.    Physical Exam BP (!) 157/82  Pulse 93   Temp 98 F (36.7 C) (Oral)   Resp 20   Ht 5' 5"  (1.651 m)   Wt 67.5 kg   SpO2 94%   BMI 24.76 kg/m  CONSTITUTIONAL: No acute distress HEENT:  Normocephalic, atraumatic, extraocular motion intact. NECK: Trachea is midline, and there is no jugular venous distension. RESPIRATORY:  Left sided chest tube in place with serosanguinous fluid in pleuravac.  There is a small air leak on expiration only.  All tubing is well secured.  Dressing is clean, dry.  Patient is tender at the  insertion site. CARDIOVASCULAR: Heart is regular without murmurs, gallops, or rubs. GI: The abdomen is soft, non-distended, with no tenderness in RUQ or epigastric area.  MUSCULOSKELETAL:  Normal muscle strength and tone in all four extremities.  No peripheral edema or cyanosis. SKIN: Skin turgor is normal. There are no pathologic skin lesions.  NEUROLOGIC:  Motor and sensation is grossly normal.  Cranial nerves are grossly intact. PSYCH:  Alert and oriented to person, place and time. Affect is normal.  Laboratory Analysis: Results for orders placed or performed during the hospital encounter of 10/21/19 (from the past 24 hour(s))  Glucose, capillary     Status: Abnormal   Collection Time: 10/24/19  5:38 PM  Result Value Ref Range   Glucose-Capillary 294 (H) 70 - 99 mg/dL   Comment 1 Notify RN   Glucose, capillary     Status: Abnormal   Collection Time: 10/24/19  9:55 PM  Result Value Ref Range   Glucose-Capillary 231 (H) 70 - 99 mg/dL  CBC     Status: Abnormal   Collection Time: 10/25/19  5:42 AM  Result Value Ref Range   WBC 11.7 (H) 4.0 - 10.5 K/uL   RBC 3.45 (L) 4.22 - 5.81 MIL/uL   Hemoglobin 10.0 (L) 13.0 - 17.0 g/dL   HCT 32.0 (L) 39.0 - 52.0 %   MCV 92.8 80.0 - 100.0 fL   MCH 29.0 26.0 - 34.0 pg   MCHC 31.3 30.0 - 36.0 g/dL   RDW 12.4 11.5 - 15.5 %   Platelets 525 (H) 150 - 400 K/uL   nRBC 0.0 0.0 - 0.2 %  Basic metabolic panel     Status: Abnormal   Collection Time: 10/25/19  5:42 AM  Result Value Ref Range   Sodium 136 135 - 145 mmol/L   Potassium 4.5 3.5 - 5.1 mmol/L   Chloride 92 (L) 98 - 111 mmol/L   CO2 31 22 - 32 mmol/L   Glucose, Bld 184 (H) 70 - 99 mg/dL   BUN 49 (H) 8 - 23 mg/dL   Creatinine, Ser 1.17 0.61 - 1.24 mg/dL   Calcium 8.5 (L) 8.9 - 10.3 mg/dL   GFR calc non Af Amer 56 (L) >60 mL/min   GFR calc Af Amer >60 >60 mL/min   Anion gap 13 5 - 15  Magnesium     Status: Abnormal   Collection Time: 10/25/19  5:42 AM  Result Value Ref Range   Magnesium  2.6 (H) 1.7 - 2.4 mg/dL  Phosphorus     Status: None   Collection Time: 10/25/19  5:42 AM  Result Value Ref Range   Phosphorus 3.6 2.5 - 4.6 mg/dL  Glucose, capillary     Status: Abnormal   Collection Time: 10/25/19  7:49 AM  Result Value Ref Range   Glucose-Capillary 176 (H) 70 - 99 mg/dL  Glucose, capillary     Status: Abnormal   Collection  Time: 10/25/19 11:59 AM  Result Value Ref Range   Glucose-Capillary 236 (H) 70 - 99 mg/dL    Imaging: NM Hepato W/EjeCT Fract  Result Date: 10/24/2019 CLINICAL DATA:  Abdominal pain. Abnormal right upper quadrant ultrasound. EXAM: NUCLEAR MEDICINE HEPATOBILIARY IMAGING TECHNIQUE: Sequential images of the abdomen were obtained out to 60 minutes following intravenous administration of radiopharmaceutical. RADIOPHARMACEUTICALS:  5.465 mCi Tc-18m Choletec IV COMPARISON:  Ultrasound 11/21/2018, CT 11/21/2018 FINDINGS: Prompt uptake and biliary excretion of activity by the liver is seen. Gallbladder activity is visualized, consistent with patency of cystic duct. Biliary activity passes into small bowel, consistent with patent common bile duct. Gallbladder ejection fraction 28%. IMPRESSION: 1. Patent cystic and common bile ducts. No evidence of acute cholecystitis. 2. Decreased gallbladder ejection fraction of 28% suggesting gallbladder dysfunction. Electronically Signed   By: NDavina PokeD.O.   On: 10/24/2019 17:11    Assessment and Plan: This is a 86y.o. male with left pleural effusion and h/o abdominal pain.  --Etiology of both is unclear at this point.  Cytology is pending on fluid, and LDH is elevated on fluid.  Currently has an airleak but chest tube appears to be working well and is in good position on CXR.  Has small residual pneumothorax on CXR.  The airleak could be related to possible lung injury during chest tube placement, as he did not have a PTX on CT on admission.  Recommend continuing chest tube to suction, as the air leak persists will  not repeat CXR, as clinically stable and will not entertain change in tube in the meantime.  Will continue to follow and discuss with Dr. OGenevive Bion Monday. --Regarding his abdominal pain, U/S appears abnormal but clinical exam remains unremarkable.  HIDA scan c/w dyskinesia.  Elective laparoscopic cholecystectomy may be entertained at a future date.

## 2019-10-25 NOTE — Progress Notes (Signed)
Initial Nutrition Assessment  DOCUMENTATION CODES:   Non-severe (moderate) malnutrition in context of social or environmental circumstances  INTERVENTION:  Provide Ensure Max Protein po BID, each supplement provides 150 kcal and 30 grams of protein. Patient prefers chocolate.  NUTRITION DIAGNOSIS:   Moderate Malnutrition related to social / environmental circumstances(inadequate oral intake related to chronic abdominal pain and constipation) as evidenced by mild fat depletion, mild muscle depletion, moderate muscle depletion, per patient/family report.  GOAL:   Patient will meet greater than or equal to 90% of their needs  MONITOR:   PO intake, Supplement acceptance, Diet advancement, Labs, Weight trends, I & O's  REASON FOR ASSESSMENT:   Malnutrition Screening Tool    ASSESSMENT:   86 year old male with PMHx of DM, RA, HTN, chronic back pain admitted with left pleural effusion and left pneumothorax s/p placement of chest tube, also with ileus and abdominal pain.   Met with patient at bedside. He reports he had a decreased appetite and intake PTA for months due to abdominal pain and constipation. He is also experiencing lack of hunger. He is unable to describe exact intake PTA. He reports he was advanced to solid foods today and was able to have beef with mashed potatoes and gravy at lunch and tolerated well. He is amenable to drinking ONS to help meet calorie/proteinneeds.  Patient reports he has lost approximately 40 lbs over the past year. No weight history in chart to trend. Patient is currently 67.5 kg (148.81 lbs).  Medications reviewed and include: Colace 200 mg BID, Novolog 0-9 units TID, Novolog 0-5 units QHS, milk of magnesium 15 mL daily, Solu-Medrol 16 mg Q24hrs IV, pantoprazole, prednisone 20 mg daily.  Labs reviewed: CBG 176-255, Chloride 92, BUN 49.  NUTRITION - FOCUSED PHYSICAL EXAM:    Most Recent Value  Orbital Region  Mild depletion  Upper Arm Region   Moderate depletion  Thoracic and Lumbar Region  Mild depletion  Buccal Region  Mild depletion  Temple Region  Moderate depletion  Clavicle Bone Region  Moderate depletion  Clavicle and Acromion Bone Region  Moderate depletion  Scapular Bone Region  Mild depletion  Dorsal Hand  Mild depletion  Patellar Region  Mild depletion  Anterior Thigh Region  Mild depletion  Posterior Calf Region  Moderate depletion  Edema (RD Assessment)  None  Hair  Reviewed  Eyes  Reviewed  Mouth  Reviewed  Skin  Reviewed  Nails  Reviewed     Diet Order:   Diet Order            DIET SOFT Room service appropriate? Yes; Fluid consistency: Thin  Diet effective now             EDUCATION NEEDS:   No education needs have been identified at this time  Skin:  Skin Assessment: Reviewed RN Assessment  Last BM:  Unknown  Height:   Ht Readings from Last 1 Encounters:  10/22/19 5' 5"  (1.651 m)   Weight:   Wt Readings from Last 1 Encounters:  10/22/19 67.5 kg   Ideal Body Weight:  61.8 kg  BMI:  Body mass index is 24.76 kg/m.  Estimated Nutritional Needs:   Kcal:  1700-1900  Protein:  85-95 grams  Fluid:  1.7-1.9 L/day  Jacklynn Barnacle, MS, RD, LDN Office: [phone] Pager: [phone] After Hours/Weekend Pager: [phone]

## 2019-10-26 ENCOUNTER — Inpatient Hospital Stay: Payer: Medicare Other

## 2019-10-26 DIAGNOSIS — E440 Moderate protein-calorie malnutrition: Secondary | ICD-10-CM | POA: Insufficient documentation

## 2019-10-26 LAB — CULTURE, BLOOD (ROUTINE X 2)
Culture: NO GROWTH
Culture: NO GROWTH

## 2019-10-26 LAB — BODY FLUID CULTURE: Culture: NO GROWTH

## 2019-10-26 LAB — BASIC METABOLIC PANEL
Anion gap: 12 (ref 5–15)
BUN: 38 mg/dL — ABNORMAL HIGH (ref 8–23)
CO2: 31 mmol/L (ref 22–32)
Calcium: 8.8 mg/dL — ABNORMAL LOW (ref 8.9–10.3)
Chloride: 93 mmol/L — ABNORMAL LOW (ref 98–111)
Creatinine, Ser: 1.21 mg/dL (ref 0.61–1.24)
GFR calc Af Amer: >60 mL/min (ref >60)
GFR calc non Af Amer: 54 mL/min — ABNORMAL LOW (ref >60)
Glucose, Bld: 149 mg/dL — ABNORMAL HIGH (ref 70–99)
Potassium: 4.5 mmol/L (ref 3.5–5.1)
Sodium: 136 mmol/L (ref 135–145)

## 2019-10-26 LAB — CBC
HCT: 30.5 % — ABNORMAL LOW (ref 39.0–52.0)
Hemoglobin: 10.3 g/dL — ABNORMAL LOW (ref 13.0–17.0)
MCH: 29.6 pg (ref 26.0–34.0)
MCHC: 33.8 g/dL (ref 30.0–36.0)
MCV: 87.6 fL (ref 80.0–100.0)
Platelets: 518 10*3/uL — ABNORMAL HIGH (ref 150–400)
RBC: 3.48 MIL/uL — ABNORMAL LOW (ref 4.22–5.81)
RDW: 12.5 % (ref 11.5–15.5)
WBC: 14.4 10*3/uL — ABNORMAL HIGH (ref 4.0–10.5)
nRBC: 0.2 % (ref 0.0–0.2)

## 2019-10-26 LAB — GLUCOSE, CAPILLARY
Glucose-Capillary: 147 mg/dL — ABNORMAL HIGH (ref 70–99)
Glucose-Capillary: 250 mg/dL — ABNORMAL HIGH (ref 70–99)
Glucose-Capillary: 299 mg/dL — ABNORMAL HIGH (ref 70–99)
Glucose-Capillary: 323 mg/dL — ABNORMAL HIGH (ref 70–99)

## 2019-10-26 LAB — PHOSPHORUS: Phosphorus: 3.4 mg/dL (ref 2.5–4.6)

## 2019-10-26 LAB — MAGNESIUM: Magnesium: 2.2 mg/dL (ref 1.7–2.4)

## 2019-10-26 LAB — CYTOLOGY - NON PAP

## 2019-10-26 NOTE — Progress Notes (Signed)
PROGRESS NOTE    David Cisneros  [id number] DOB: October 21, 1931 DOA: 10/21/2019 PCP: Valera Castle, MD       Assessment & Plan:   Principal Problem:   Pleural effusion on left Active Problems:   Hyperkalemia   Hypertension   Chronic back pain   CKD (chronic kidney disease), stage IIIa   Leukocytosis   Pneumothorax on left   Rheumatoid arthritis (HCC)   Malnutrition of moderate degree   Left pleural effusion: Etiology unclear, likely secondary to complication from RA as per intensvist. S/p left chest tube placement. Per EDP, approximately 3 L of bloody fluid were removed. Pleural fluid analysis pending still. Pleural fluid cx NGTD. Morphine prn for pain. Pulmon following and recs apprec   Pneumothorax on left: small. S/p left chest tube & draining bloody fluid. W/ air leak present  & put to water seal as per surg. Managed as per surgery.   Hyperkalemia: resolved. WNL today again. Will continue to monitor   Ileus: etiology unclear. Pt pulled out NG tube 10/24/19 and the NG tube was left out. Evidently pt is passing gas but no bowel movement yet still. Continue on colace, dulcolax, & milk of Mg. Possible bloody bilious vomiting on evening on 10/22/19.  H&H are stable. GI signed off  Abd pain: etiology unclear, ileus vs acalculous cholecystitis. US gallbladder shows Sludge within the gallbladder, with thickened gallbladder wall, pericholecystic fluid, but negative sonographic Murphy. Acalculous cholecystitis remains a concern, although liver disease and other edema forming states can also produce thickening of the gallbladder wall. Correlation with nuclear medicine hepatobiliary imaging may be considered. HIDA shows no evidence of acute cholecystitis & decreased EF of 28%, so pt can f/u outpatient for possible elective cholecystectomy w/ Dr. Inda Merlin.  Possible hematemesis: H&H are stable. No need for a transfusion at this time. GI recs apprec   Rheumatoid arthritis: at home on  Xeljanz and prednisone & will continue these meds but an increased dose of prednisone from 10 to 20mg  daily. Continue on IV solumedrol and hold prednisone   AKI on CKDIIIa: baseline creatinine 1.33. Cr is labile, trending up today. Will continue to monitor   Thrombocytosis: likely reactive. Will continue to monitor   Normocytic anemia: H&H are stable. No need for a transfusion at this time. Will continue to monitor   Hypertension: not taking medication for blood pressure at home. Hydralazine prn.  Chronic back pain: morphine prn for pain   Leukocytosis: likely secondary to steroid use. Will continue to monitor       DVT prophylaxis: SCDs secondary to bloody pleural fluid output Code Status: DNR Family Communication:  Disposition Plan:    Consultants:   Pulmon, surgery   Procedures:  S/p chest tube placement on 10/21/19; s/p NG tube placement on 10/22/19 & pt pulled out NG tube 10/24/19   Antimicrobials:    Subjective: Pt c/o fatigue  Objective: Vitals:   10/25/19 0622 10/25/19 1327 10/25/19 2010 10/26/19 0439  BP: (!) 157/82 (!) 117/54 138/64 (!) 155/69  Pulse: 93 90 87 84  Resp:  20 (!) 24 16  Temp:  97.9 F (36.6 C) 98 F (36.7 C) 98 F (36.7 C)  TempSrc:  Axillary Oral Oral  SpO2: 94% 95% 95% 94%  Weight:      Height:        Intake/Output Summary (Last 24 hours) at 10/26/2019 0802 Last data filed at 10/26/2019 [id number] Gross per 24 hour  Intake --  Output 1175 ml  Net -1175  ml   Filed Weights   10/21/19 1050 10/22/19 2241  Weight: 72.6 kg 67.5 kg    Examination:  General exam: Appears calm  & comfortable  Respiratory system: diminished breath sounds b/l. Chest tube in place still draining bloody fluid Cardiovascular system: S1 & S2 +. No rubs, gallops or clicks.  Gastrointestinal system: Abdomen has mild distention, soft and tenderness to palpation.  Hypoactive bowel sounds present.  Central nervous system: Alert and oriented. Moves all 4  extremities. Psychiatry: Normal mood and affect today       Data Reviewed: I have personally reviewed following labs and imaging studies  CBC: Recent Labs  Lab 10/22/19 1946 10/23/19 0430 10/24/19 0559 10/25/19 0542 10/26/19 0527  WBC 15.5* 15.0* 12.7* 11.7* 14.4*  NEUTROABS 14.5*  --   --   --   --   HGB 10.3* 9.8* 9.9* 10.0* 10.3*  HCT 32.6* 29.4* 29.8* 32.0* 30.5*  MCV 92.9 88.6 89.0 92.8 87.6  PLT 571* 524* 508* 525* 0000000*   Basic Metabolic Panel: Recent Labs  Lab 10/22/19 1946 10/23/19 0430 10/23/19 1138 10/24/19 0559 10/25/19 0542 10/26/19 0527  NA 133* 136  --  137 136 136  K 5.2* 5.5* 4.2 5.0 4.5 4.5  CL 95* 99  --  92* 92* 93*  CO2 27 27  --  29 31 31   GLUCOSE 223* 149*  --  175* 184* 149*  BUN 41* 42*  --  52* 49* 38*  CREATININE 1.53* 1.35*  --  1.49* 1.17 1.21  CALCIUM 8.0* 8.0*  --  8.4* 8.5* 8.8*  MG  --   --   --  2.8* 2.6* 2.2  PHOS  --   --   --  5.2* 3.6 3.4   GFR: Estimated Creatinine Clearance: 38.1 mL/min (by C-G formula based on SCr of 1.21 mg/dL). Liver Function Tests: Recent Labs  Lab 10/22/19 1946 10/24/19 0559  AST 16 14*  ALT 27 20  ALKPHOS 59 53  BILITOT 0.7 0.9  PROT 6.2* 6.1*  ALBUMIN 2.7* 2.7*   Recent Labs  Lab 10/22/19 1946  LIPASE 77*   No results for input(s): AMMONIA in the last 168 hours. Coagulation Profile: Recent Labs  Lab 10/21/19 1353 10/22/19 1946  INR 1.1 1.2   Cardiac Enzymes: No results for input(s): CKTOTAL, CKMB, CKMBINDEX, TROPONINI in the last 168 hours. BNP (last 3 results) No results for input(s): PROBNP in the last 8760 hours. HbA1C: No results for input(s): HGBA1C in the last 72 hours. CBG: Recent Labs  Lab 10/25/19 0749 10/25/19 1159 10/25/19 1556 10/25/19 2101 10/26/19 0750  GLUCAP 176* 236* 255* 274* 147*   Lipid Profile: No results for input(s): CHOL, HDL, LDLCALC, TRIG, CHOLHDL, LDLDIRECT in the last 72 hours. Thyroid Function Tests: No results for input(s): TSH, T4TOTAL,  FREET4, T3FREE, THYROIDAB in the last 72 hours. Anemia Panel: No results for input(s): VITAMINB12, FOLATE, FERRITIN, TIBC, IRON, RETICCTPCT in the last 72 hours. Sepsis Labs: No results for input(s): PROCALCITON, LATICACIDVEN in the last 168 hours.  Recent Results (from the past 240 hour(s))  Respiratory Panel by RT PCR (Flu A&B, Covid) - Nasopharyngeal Swab     Status: None   Collection Time: 10/21/19 12:33 PM   Specimen: Nasopharyngeal Swab  Result Value Ref Range Status   SARS Coronavirus 2 by RT PCR NEGATIVE NEGATIVE Final    Comment: (NOTE) SARS-CoV-2 target nucleic acids are NOT DETECTED. The SARS-CoV-2 RNA is generally detectable in upper respiratoy specimens during the acute  phase of infection. The lowest concentration of SARS-CoV-2 viral copies this assay can detect is 131 copies/mL. A negative result does not preclude SARS-Cov-2 infection and should not be used as the sole basis for treatment or other patient management decisions. A negative result may occur with  improper specimen collection/handling, submission of specimen other than nasopharyngeal swab, presence of viral mutation(s) within the areas targeted by this assay, and inadequate number of viral copies (<131 copies/mL). A negative result must be combined with clinical observations, patient history, and epidemiological information. The expected result is Negative. Fact Sheet for Patients:  PinkCheek.be Fact Sheet for Healthcare Providers:  GravelBags.it This test is not yet ap proved or cleared by the [place] FDA and  has been authorized for detection and/or diagnosis of SARS-CoV-2 by FDA under an Emergency Use Authorization (EUA). This EUA will remain  in effect (meaning this test can be used) for the duration of the COVID-19 declaration under Section 564(b)(1) of the Act, 21 U.S.C. section 360bbb-3(b)(1), unless the authorization is terminated  or revoked sooner.    Influenza A by PCR NEGATIVE NEGATIVE Final   Influenza B by PCR NEGATIVE NEGATIVE Final    Comment: (NOTE) The Xpert Xpress SARS-CoV-2/FLU/RSV assay is intended as an aid in  the diagnosis of influenza from Nasopharyngeal swab specimens and  should not be used as a sole basis for treatment. Nasal washings and  aspirates are unacceptable for Xpert Xpress SARS-CoV-2/FLU/RSV  testing. Fact Sheet for Patients: PinkCheek.be Fact Sheet for Healthcare Providers: GravelBags.it This test is not yet approved or cleared by the [place] FDA and  has been authorized for detection and/or diagnosis of SARS-CoV-2 by  FDA under an Emergency Use Authorization (EUA). This EUA will remain  in effect (meaning this test can be used) for the duration of the  Covid-19 declaration under Section 564(b)(1) of the Act, 21  U.S.C. section 360bbb-3(b)(1), unless the authorization is  terminated or revoked. Performed at [facility], [address] [place], [place] [address]   Body fluid culture (includes gram stain)     Status: None (Preliminary result)   Collection Time: 10/21/19  1:53 PM   Specimen: Pleural Fluid  Result Value Ref Range Status   Specimen Description   Final    PLEURAL Performed at [facility], [address] [place], [place] [address]    Special Requests   Final    NONE Performed at [facility], [address] [place], [place] [address]    Gram Stain   Final    RARE WBC PRESENT, PREDOMINANTLY PMN NO ORGANISMS SEEN    Culture   Final    NO GROWTH 3 DAYS Performed at [facility], [place] [address] [place], [place] [address]    Report Status PENDING  Incomplete  CULTURE, BLOOD (ROUTINE X 2) w Reflex to ID Panel     Status: None   Collection Time: 10/21/19 10:04 PM   Specimen: BLOOD  Result Value Ref Range Status   Specimen Description BLOOD RIGHT ANTECUBITAL   Final   Special Requests   Final    BOTTLES DRAWN AEROBIC AND ANAEROBIC Blood Culture results may not be optimal due to an excessive volume of blood received in culture bottles   Culture   Final    NO GROWTH 5 DAYS Performed at [facility], [address] [place],  [address]    Report Status 10/26/2019 FINAL  Final  CULTURE, BLOOD (ROUTINE X 2) w  Reflex to ID Panel     Status: None   Collection Time: 10/21/19 10:04 PM   Specimen: BLOOD  Result Value Ref Range Status   Specimen Description BLOOD RIGHT ANTECUBITAL  Final   Special Requests   Final    BOTTLES DRAWN AEROBIC AND ANAEROBIC Blood Culture results may not be optimal due to an excessive volume of blood received in culture bottles   Culture   Final    NO GROWTH 5 DAYS Performed at [facility], [address] [place], [place] [address]    Report Status 10/26/2019 FINAL  Final  MRSA PCR Screening     Status: None   Collection Time: 10/22/19 10:53 PM   Specimen: Nasal Mucosa; Nasopharyngeal  Result Value Ref Range Status   MRSA by PCR NEGATIVE NEGATIVE Final    Comment:        The GeneXpert MRSA Assay (FDA approved for NASAL specimens only), is one component of a comprehensive MRSA colonization surveillance program. It is not intended to diagnose MRSA infection nor to guide or monitor treatment for MRSA infections. Performed at [facility], [address] [place], [place] [address]          Radiology Studies: NM Hepato W/EjeCT Fract  Result Date: 10/24/2019 CLINICAL DATA:  Abdominal pain. Abnormal right upper quadrant ultrasound. EXAM: NUCLEAR MEDICINE HEPATOBILIARY IMAGING TECHNIQUE: Sequential images of the abdomen were obtained out to 60 minutes following intravenous administration of radiopharmaceutical. RADIOPHARMACEUTICALS:  5.465 mCi Tc-40m  Choletec IV COMPARISON:  Ultrasound 11/21/2018, CT 11/21/2018 FINDINGS: Prompt uptake and biliary excretion of activity by the  liver is seen. Gallbladder activity is visualized, consistent with patency of cystic duct. Biliary activity passes into small bowel, consistent with patent common bile duct. Gallbladder ejection fraction 28%. IMPRESSION: 1. Patent cystic and common bile ducts. No evidence of acute cholecystitis. 2. Decreased gallbladder ejection fraction of 28% suggesting gallbladder dysfunction. Electronically Signed   By: Davina Poke D.O.   On: 10/24/2019 17:11   DG Chest Port 1 View  Result Date: 10/24/2019 CLINICAL DATA:  Pneumothorax, left side EXAM: PORTABLE CHEST 1 VIEW COMPARISON:  Chest radiograph 10/23/2019 at 10:40 a.m. FINDINGS: Stable cardiomediastinal contours. Interval removal of a nasogastric tube. Left-sided chest tube remains in place. Stable appearance of a small left-sided pneumothorax. Persistent heterogeneous opacities at the left lung base. The right lung is clear. No significant pleural effusion. IMPRESSION: Interval removal of a nasogastric tube. Otherwise stable chest with small left-sided pneumothorax and left basilar airspace disease. Electronically Signed   By: Audie Pinto M.D.   On: 10/24/2019 09:31        Scheduled Meds: . dextromethorphan-guaiFENesin  1 tablet Oral BID  . docusate  200 mg Oral BID  . finasteride  5 mg Oral Daily  . insulin aspart  0-5 Units Subcutaneous QHS  . insulin aspart  0-9 Units Subcutaneous TID WC  . linaclotide  145 mcg Oral q morning - 10a  . magnesium hydroxide  15 mL Oral Daily  . methylPREDNISolone (SOLU-MEDROL) injection  16 mg Intravenous Q24H  . pantoprazole (PROTONIX) IV  40 mg Intravenous Q24H  . predniSONE  20 mg Oral Daily  . Ensure Max Protein  11 oz Oral BID BM  . scopolamine  1 patch Transdermal Q72H  . sodium chloride flush  3 mL Intravenous Once  . Tofacitinib Citrate  5 mg Oral BID   Continuous Infusions:    LOS: 4 days    Time spent: 30 mins  Wyvonnia Dusky, MD [facility] Pager [phone]  If  7PM-7AM, please contact night-coverage [email or web address] Password [email or web address] 10/26/2019, 8:02 AM

## 2019-10-26 NOTE — Evaluation (Signed)
Physical Therapy Evaluation Patient Details Name: David Cisneros MRN: [id number] DOB: 1931-02-10 Today's Date: 10/26/2019   History of Present Illness  Per MD notes: Pt is an 86 y.o. male with medical history significant of former smoker, hypertension, diabetes mellitus, rheumatoid arthritis, chronic back pain, CKD stage IIIa, who presents with shortness of breath.  Patient was found to have a large left-sided pleural effusion by CXR. Left side chest tube was placed in ED.  MD assessment includes: L pleural effusion, hyperkalemia, HTN, chronic back pain, CKD III, leukocytosis, and RA.    Clinical Impression  Pt presented with deficits in strength, transfers, mobility, gait, balance, and activity tolerance.  Pt education provided on log roll technique to the R side for decreased caregiver assistance but still required min-mod A to get up to sitting at the EOB.  Pt presented with significant posterior instability upon initial stand with only ankle strategy utilized to try to correct but ultimately required physical assistance to prevent LOB.  Pt amb a max of 10' before requiring to return to sitting with SpO2 94% on room air and HR 119 bpm.  Pt will benefit from PT services in a SNF setting upon discharge to safely address above deficits for decreased caregiver assistance and eventual return to PLOF.      Follow Up Recommendations SNF    Equipment Recommendations  Rolling walker with 5" wheels    Recommendations for Other Services       Precautions / Restrictions Precautions Precautions: Fall Precaution Comments: L sided chest tube Restrictions Weight Bearing Restrictions: No      Mobility  Bed Mobility Overal bed mobility: Needs Assistance Bed Mobility: Rolling;Sidelying to Sit Rolling: Min assist Sidelying to sit: Min assist;Mod assist       General bed mobility comments: Pt education provided for R-side rolling and then sidelying to sit with min to mod A required along with mod  verbal/tactile cues for sequencing  Transfers Overall transfer level: Needs assistance Equipment used: Rolling walker (2 wheeled) Transfers: Sit to/from Stand Sit to Stand: Min assist;Mod assist         General transfer comment: Min-mod A to stand and for stability upon initial stand with posterior instability  Ambulation/Gait Ambulation/Gait assistance: Min guard Gait Distance (Feet): 10 Feet Assistive device: Rolling walker (2 wheeled) Gait Pattern/deviations: Step-through pattern;Decreased step length - right;Decreased step length - left;Trunk flexed Gait velocity: decreased   General Gait Details: Slow, effortful cadence with short B step length with pt able to amb a max of 10' with HR 119 and SpO2 94% on room air  Stairs            Wheelchair Mobility    Modified Rankin (Stroke Patients Only)       Balance Overall balance assessment: Needs assistance Sitting-balance support: Bilateral upper extremity supported;Feet supported Sitting balance-Leahy Scale: Good     Standing balance support: Bilateral upper extremity supported Standing balance-Leahy Scale: Poor Standing balance comment: Min-mod A to prevent posterior LOB in standing                             Pertinent Vitals/Pain Pain Assessment: No/denies pain    Home Living Family/patient expects to be discharged to:: Private residence Living Arrangements: Spouse/significant other Available Help at Discharge: Family;Available 24 hours/day Type of Home: House Home Access: Stairs to enter Entrance Stairs-Rails: Right;Left(Too wide to use both) Entrance Stairs-Number of Steps: 5 Home Layout: One level Home Equipment:  Shower seat - built in;Walker - 4 wheels;Cane - single point Additional Comments: Spouse is not able to assist secondary to dementia but son lives next door and is available 24/7    Prior Function Level of Independence: Independent with assistive device(s)          Comments: Mod Ind amb in the home with a rollator and with a SPC in the community, no fall history, Ind with ADLs     Hand Dominance        Extremity/Trunk Assessment   Upper Extremity Assessment Upper Extremity Assessment: Generalized weakness    Lower Extremity Assessment Lower Extremity Assessment: Generalized weakness       Communication   Communication: No difficulties  Cognition Arousal/Alertness: Awake/alert Behavior During Therapy: WFL for tasks assessed/performed Overall Cognitive Status: Within Functional Limits for tasks assessed                                        General Comments      Exercises Total Joint Exercises Ankle Circles/Pumps: Strengthening;Both;10 reps Quad Sets: Strengthening;Both;10 reps Gluteal Sets: Strengthening;Both;10 reps Towel Squeeze: Strengthening;Both;10 reps Heel Slides: AROM;Both;5 reps Long Arc Quad: Strengthening;Both;10 reps Knee Flexion: Strengthening;Both;10 reps Marching in Standing: AROM;Both;Standing;10 reps Other Exercises Other Exercises: HEP education for BLE APs, QS, and GS x 10 each every 1-2 hours daily   Assessment/Plan    PT Assessment Patient needs continued PT services  PT Problem List Decreased strength;Decreased activity tolerance;Decreased balance;Decreased mobility;Decreased knowledge of use of DME       PT Treatment Interventions DME instruction;Gait training;Stair training;Functional mobility training;Therapeutic activities;Therapeutic exercise;Balance training;Patient/family education    PT Goals (Current goals can be found in the Care Plan section)  Acute Rehab PT Goals Patient Stated Goal: To get better and back home PT Goal Formulation: With patient Time For Goal Achievement: 10/27/2019 Potential to Achieve Goals: Good    Frequency Min 2X/week   Barriers to discharge Inaccessible home environment      Co-evaluation               AM-PAC PT "6 Clicks" Mobility   Outcome Measure Help needed turning from your back to your side while in a flat bed without using bedrails?: A Lot Help needed moving from lying on your back to sitting on the side of a flat bed without using bedrails?: A Lot Help needed moving to and from a bed to a chair (including a wheelchair)?: A Little Help needed standing up from a chair using your arms (e.g., wheelchair or bedside chair)?: A Little Help needed to walk in hospital room?: A Little Help needed climbing 3-5 steps with a railing? : Total 6 Click Score: 14    End of Session Equipment Utilized During Treatment: Gait belt;Other (comment)(Gait belt placed high under the arms to avoid chest tube) Activity Tolerance: Patient tolerated treatment well Patient left: in chair;with call bell/phone within reach;with chair alarm set;with family/visitor present Nurse Communication: Mobility status PT Visit Diagnosis: Unsteadiness on feet (R26.81);Muscle weakness (generalized) (M62.81);Difficulty in walking, not elsewhere classified (R26.2)    Time: [id number] PT Time Calculation (min) (ACUTE ONLY): 38 min   Charges:   PT Evaluation $PT Eval Moderate Complexity: 1 Mod PT Treatments $Therapeutic Exercise: 8-22 mins        D. Royetta Asal PT, DPT 10/26/19, 3:31 PM

## 2019-10-26 NOTE — Progress Notes (Signed)
  Patient ID: David Cisneros, male   DOB: 09-09-1931, 86 y.o.   MRN: [id number]  HISTORY: Overall the patient feels slightly better than he did upon admission.  He still states that he feels sick.  He is unable to elaborate upon that anymore but states that he just feels unwell and feels as if he would like to just give up.  He does not have any specific abdominal pain at this time.  He did relate to [place] that he has had a chest CT made as well as a chest x-ray within the last several months.  These were made at a [facility] office in [place].  I did speak with the son who will try to arrange for the CT scans to be given to him so that I may review those.   Vitals:   10/25/19 2010 10/26/19 0439  BP: 138/64 (!) 155/69  Pulse: 87 84  Resp: (!) 24 16  Temp: 98 F (36.7 C) 98 F (36.7 C)  SpO2: 95% 94%     EXAM:    Resp: Lungs are clear bilaterally.  No respiratory distress, normal effort. Heart:  Regular without murmurs Abd:  Abdomen is soft, non distended and non tender. No masses are palpable.  There is no rebound and no guarding.  Neurological: Alert and oriented to person, place, and time. Coordination normal.  Skin: Skin is warm and dry. No rash noted. No diaphoretic. No erythema. No pallor.  Psychiatric: Normal mood and affect. Normal behavior. Judgment and thought content normal.   I did change the dressings along the chest tube.  The chest tube entrance site is clean dry and intact.  There is no erythema or drainage.  I also interrogated the chest tube system.  The small air leak does appear to be coming from the chest tube itself.  I did place the chest tube to waterseal and the air leak did improve.  There is a small amount of serosanguineous drainage present within the chest tube.   ASSESSMENT: Left pleural effusion of unknown etiology.  The pathology is still pending.  There is no evidence of infection.  There is no evidence of a right-sided pleural effusion or cardiomegaly  making cardiac source unlikely.   PLAN:   The son will obtain the outside CT scan for me.  We will place the chest tube to waterseal now.  We will repeat his chest x-ray this afternoon.  He may need an additional chest CT to determine the etiology of his pleural effusion and air leak.    Nestor Lewandowsky, MDPatient ID: David Cisneros, male   DOB: 08/08/1931, 86 y.o.   MRN: [id number]

## 2019-10-26 NOTE — Care Management Important Message (Signed)
Important Message  Patient Details  Name: UGO BOURN MRN: [id number] Date of Birth: 1931-06-06   Medicare Important Message Given:  Yes     Dannette Barbara 10/26/2019, 12:03 PM

## 2019-10-27 LAB — CBC
HCT: 32.2 % — ABNORMAL LOW (ref 39.0–52.0)
Hemoglobin: 10.3 g/dL — ABNORMAL LOW (ref 13.0–17.0)
MCH: 29.3 pg (ref 26.0–34.0)
MCHC: 32 g/dL (ref 30.0–36.0)
MCV: 91.5 fL (ref 80.0–100.0)
Platelets: 474 10*3/uL — ABNORMAL HIGH (ref 150–400)
RBC: 3.52 MIL/uL — ABNORMAL LOW (ref 4.22–5.81)
RDW: 12.7 % (ref 11.5–15.5)
WBC: 12.6 10*3/uL — ABNORMAL HIGH (ref 4.0–10.5)
nRBC: 0.7 % — ABNORMAL HIGH (ref 0.0–0.2)

## 2019-10-27 LAB — BASIC METABOLIC PANEL
Anion gap: 12 (ref 5–15)
BUN: 44 mg/dL — ABNORMAL HIGH (ref 8–23)
CO2: 29 mmol/L (ref 22–32)
Calcium: 8.4 mg/dL — ABNORMAL LOW (ref 8.9–10.3)
Chloride: 96 mmol/L — ABNORMAL LOW (ref 98–111)
Creatinine, Ser: 1.13 mg/dL (ref 0.61–1.24)
GFR calc Af Amer: >60 mL/min (ref >60)
GFR calc non Af Amer: 59 mL/min — ABNORMAL LOW (ref >60)
Glucose, Bld: 161 mg/dL — ABNORMAL HIGH (ref 70–99)
Potassium: 3.8 mmol/L (ref 3.5–5.1)
Sodium: 137 mmol/L (ref 135–145)

## 2019-10-27 LAB — GLUCOSE, CAPILLARY
Glucose-Capillary: 168 mg/dL — ABNORMAL HIGH (ref 70–99)
Glucose-Capillary: 287 mg/dL — ABNORMAL HIGH (ref 70–99)
Glucose-Capillary: 210 mg/dL — ABNORMAL HIGH (ref 70–99)
Glucose-Capillary: 292 mg/dL — ABNORMAL HIGH (ref 70–99)

## 2019-10-27 LAB — MAGNESIUM: Magnesium: 2.2 mg/dL (ref 1.7–2.4)

## 2019-10-27 LAB — PHOSPHORUS: Phosphorus: 3.3 mg/dL (ref 2.5–4.6)

## 2019-10-27 MED ORDER — INSULIN ASPART 100 UNIT/ML ~~LOC~~ SOLN
4.0000 [IU] | Freq: Three times a day (TID) | SUBCUTANEOUS | Status: DC
  Administered 2019-10-27 – 2019-10-28 (×4): 4 [IU] via SUBCUTANEOUS
  Filled 2019-10-27 (×5): qty 1

## 2019-10-27 MED ORDER — PEG 3350-KCL-NA BICARB-NACL 420 G PO SOLR
4000.0000 mL | Freq: Once | ORAL | Status: AC
  Administered 2019-10-27: 4000 mL via ORAL
  Filled 2019-10-27: qty 4000

## 2019-10-27 NOTE — Progress Notes (Signed)
Inpatient Diabetes Program Recommendations  AACE/ADA: New Consensus Statement on Inpatient Glycemic Control (2015)  Target Ranges:  Prepandial:   less than 140 mg/dL      Peak postprandial:   less than 180 mg/dL (1-2 hours)      Critically ill patients:  140 - 180 mg/dL   Lab Results  Component Value Date   GLUCAP 287 (H) 10/27/2019   HGBA1C 6.9 (H) 10/21/2019    Review of Glycemic Control  Results for ARCHIT, KNOTH (MRN [id number]) as of 10/27/2019 11:51  Ref. Range 10/26/2019 12:23 10/26/2019 16:49 10/26/2019 20:59 10/27/2019 08:37 10/27/2019 11:48  Glucose-Capillary Latest Ref Range: 70 - 99 mg/dL 250 (H) 299 (H) 323 (H) 168 (H) 287 (H)    Diabetes history: DM2 Outpatient Diabetes medications: Metformin 500 BID; Amaryl 4 mg BID; Prednisone 5 mg QD Current orders for Inpatient glycemic control: Novolog 0-9 TID with meals and 0-5 HS; Solumedrol 16 mg daily; Prednisone mg 20 daily  Inpatient Diabetes Program Recommendations:     -Novolog 4 units TID with meals  Thank you, Geoffry Paradise, RN, BSN Diabetes Coordinator Inpatient Diabetes Program [phone] (team pager from 8a-5p)

## 2019-10-27 NOTE — Progress Notes (Signed)
PROGRESS NOTE    David Cisneros  [id number] DOB: July 04, 1931 DOA: 10/21/2019 PCP: Valera Castle, MD   HPI was taken from Dr. Blaine Hamper: David Cisneros is a 86 y.o. male with medical history significant of former smoker, hypertension, diabetes mellitus, rheumatoid arthritis, chronic back pain, CKD stage IIIa, who presents with shortness of breath.  Patient states that he has been having shortness of breath for more than 2 weeks, which has worsened in the past several days. Initially no chest pain reported to ED physician, but developed chest pain after chest tubes were placed on the left side.  No cough, fever or chills.  Patient is constipated, but no nausea, vomiting, diarrhea or abdominal pain.  No symptoms of UTI.  No unilateral weakness. Patient was found to have a large left-sided pleural effusion by CXR. Left side chest tube is placed in ED.   ED Course: pt was found to have WBC 13.8, negative RVP for COVID-19 test, potassium 5.4, slightly worsening renal function, temperature normal, blood pressure 180/81, tachycardia, oxygen sat 96% 2 L nasal cannula oxygen. Pt is based on stepdown bed for observation.   Hospital Course from Dr. Lenise Herald 10/22/19-10/27/19: Pt was found to have a left pneumothorax & left pleural effusion. S/p chest tube placed in the ER w/ bloody pleural fluid.The pleural fluid is lymphocyte predominant and cytology is neg for malignant cells. Rheumatoid factors for pleural fluid is pending. Effusion is likely secondary to RA as per ICU doc. Chest tube is still in placed and managed by surgery. Furthermore, pt had possibly one episode of bloody vomit in the ER, so NG tube was placed after CT showed ileus. Pt pulled out the NG tube on 10/24/19 and it was left out. GI was consulted for possible hematemesis but H&H remained stable and pt did not require a transfusion. GI has since signed off. Pt has yet to have a bowel movement despite multiple meds to facilitate a bowel  movement. Will give polyethylene glycol x1 today.     Assessment & Plan:   Principal Problem:   Pleural effusion on left Active Problems:   Hyperkalemia   Hypertension   Chronic back pain   CKD (chronic kidney disease), stage IIIa   Leukocytosis   Pneumothorax on left   Rheumatoid arthritis (HCC)   Malnutrition of moderate degree   Left pleural effusion: Etiology unclear, likely secondary to complication from RA as per intensvist. S/p left chest tube placement. Per EDP, approximately 3 L of bloody fluid were removed. Pleural fluid analysis is lymphocyte predominant, cytology is neg for malignancy, Rheumatoid factors pending. Pleural fluid cx NGTD. Morphine prn for pain. Pulmon following and recs apprec   Pneumothorax on left: small. S/p left chest tube & draining bloody fluid. W/ air leak present  as per surg. Managed as per surgery.   Hyperkalemia: resolved. WNL today again. Will continue to monitor   Ileus: etiology unclear. Pt pulled out NG tube 10/24/19 and the NG tube was left out. Evidently pt is passing gas but still no bowel movement yet. Continue on colace, dulcolax, milk of Mg & will give polyethylene glycol x1 today. No bowel movements yet. Possible bloody bilious vomiting on evening on 10/22/19.  H&H are stable. GI signed off  Abd pain: etiology unclear, ileus vs acalculous cholecystitis. US gallbladder shows sludge within the gallbladder, with thickened gallbladder wall, pericholecystic fluid, but negative sonographic Murphy. Acalculous cholecystitis remains a concern. HIDA shows no evidence of acute cholecystitis & decreased EF  of 28%, so pt can f/u outpatient for possible elective cholecystectomy w/ Dr. Hampton Abbot.  Possible hematemesis: H&H are stable. No need for a transfusion at this time. GI recs apprec   Rheumatoid arthritis: at home on Xeljanz and prednisone & will continue these meds but an increased dose of prednisone from 10 to 20mg  daily. Continue on IV solumedrol  and hold prednisone   AKI on CKDIIIa: baseline creatinine 1.33. Cr is labile, trending down today. Will continue to monitor   Thrombocytosis: likely reactive. Will continue to monitor   Normocytic anemia: H&H are stable. No need for a transfusion at this time. Will continue to monitor   Hypertension: not taking medication for blood pressure at home. Hydralazine prn.  Chronic back pain: morphine prn for pain   Leukocytosis: likely secondary to steroid use. Will continue to monitor   Generalized weakness: PT recommends SNF.    DVT prophylaxis: SCDs secondary to bloody pleural fluid output Code Status: DNR Family Communication: talked w/ pt's son, Louie Casa, via telephone and answered his questions. Please call daily w/ an update  Disposition Plan:    Consultants:   Pulmon, surgery, GI   Procedures:  S/p chest tube placement on 10/21/19; s/p NG tube placement on 10/22/19 & pt pulled out NG tube 10/24/19   Antimicrobials:    Subjective: Pt c/o constipation still.  Objective: Vitals:   10/26/19 0439 10/26/19 1224 10/26/19 1951 10/27/19 0434  BP: (!) 155/69 122/65 (!) 156/74 (!) 152/74  Pulse: 84 92 (!) 110 89  Resp: 16 19 20 18   Temp: 98 F (36.7 C) 98.4 F (36.9 C) 99.1 F (37.3 C) 98.6 F (37 C)  TempSrc: Oral Oral Oral Oral  SpO2: 94% 94% 97% 94%  Weight:      Height:        Intake/Output Summary (Last 24 hours) at 10/27/2019 0817 Last data filed at 10/27/2019 0500 Gross per 24 hour  Intake 120 ml  Output 950 ml  Net -830 ml   Filed Weights   10/21/19 1050 10/22/19 2241  Weight: 72.6 kg 67.5 kg    Examination:  General exam: Appears calm  & comfortable  Respiratory system: decreased breath sounds b/l. Chest tube in place still draining bloody fluid Cardiovascular system: S1 & S2 +. No rubs, gallops or clicks.  Gastrointestinal system: Abdomen has mild distention, soft and tenderness to palpation.  Hypoactive bowel sounds present.  Central nervous system:  Alert and oriented. Moves all 4 extremities. Psychiatry: flat mood and affect     Data Reviewed: I have personally reviewed following labs and imaging studies  CBC: Recent Labs  Lab 10/22/19 1946 10/23/19 0430 10/24/19 0559 10/25/19 0542 10/26/19 0527 10/27/19 0551  WBC 15.5* 15.0* 12.7* 11.7* 14.4* 12.6*  NEUTROABS 14.5*  --   --   --   --   --   HGB 10.3* 9.8* 9.9* 10.0* 10.3* 10.3*  HCT 32.6* 29.4* 29.8* 32.0* 30.5* 32.2*  MCV 92.9 88.6 89.0 92.8 87.6 91.5  PLT 571* 524* 508* 525* 518* [id number]*   Basic Metabolic Panel: Recent Labs  Lab 10/23/19 0430 10/23/19 1138 10/24/19 0559 10/25/19 0542 10/26/19 0527 10/27/19 0551  NA 136  --  137 136 136 137  K 5.5* 4.2 5.0 4.5 4.5 3.8  CL 99  --  92* 92* 93* 96*  CO2 27  --  29 31 31 29   GLUCOSE 149*  --  175* 184* 149* 161*  BUN 42*  --  52* 49* 38* 44*  CREATININE 1.35*  --  1.49* 1.17 1.21 1.13  CALCIUM 8.0*  --  8.4* 8.5* 8.8* 8.4*  MG  --   --  2.8* 2.6* 2.2 2.2  PHOS  --   --  5.2* 3.6 3.4 3.3   GFR: Estimated Creatinine Clearance: 40.8 mL/min (by C-G formula based on SCr of 1.13 mg/dL). Liver Function Tests: Recent Labs  Lab 10/22/19 1946 10/24/19 0559  AST 16 14*  ALT 27 20  ALKPHOS 59 53  BILITOT 0.7 0.9  PROT 6.2* 6.1*  ALBUMIN 2.7* 2.7*   Recent Labs  Lab 10/22/19 1946  LIPASE 77*   No results for input(s): AMMONIA in the last 168 hours. Coagulation Profile: Recent Labs  Lab 10/21/19 1353 10/22/19 1946  INR 1.1 1.2   Cardiac Enzymes: No results for input(s): CKTOTAL, CKMB, CKMBINDEX, TROPONINI in the last 168 hours. BNP (last 3 results) No results for input(s): PROBNP in the last 8760 hours. HbA1C: No results for input(s): HGBA1C in the last 72 hours. CBG: Recent Labs  Lab 10/25/19 2101 10/26/19 0750 10/26/19 1223 10/26/19 1649 10/26/19 2059  GLUCAP 274* 147* 250* 299* 323*   Lipid Profile: No results for input(s): CHOL, HDL, LDLCALC, TRIG, CHOLHDL, LDLDIRECT in the last 72  hours. Thyroid Function Tests: No results for input(s): TSH, T4TOTAL, FREET4, T3FREE, THYROIDAB in the last 72 hours. Anemia Panel: No results for input(s): VITAMINB12, FOLATE, FERRITIN, TIBC, IRON, RETICCTPCT in the last 72 hours. Sepsis Labs: No results for input(s): PROCALCITON, LATICACIDVEN in the last 168 hours.  Recent Results (from the past 240 hour(s))  Respiratory Panel by RT PCR (Flu A&B, Covid) - Nasopharyngeal Swab     Status: None   Collection Time: 10/21/19 12:33 PM   Specimen: Nasopharyngeal Swab  Result Value Ref Range Status   SARS Coronavirus 2 by RT PCR NEGATIVE NEGATIVE Final    Comment: (NOTE) SARS-CoV-2 target nucleic acids are NOT DETECTED. The SARS-CoV-2 RNA is generally detectable in upper respiratoy specimens during the acute phase of infection. The lowest concentration of SARS-CoV-2 viral copies this assay can detect is 131 copies/mL. A negative result does not preclude SARS-Cov-2 infection and should not be used as the sole basis for treatment or other patient management decisions. A negative result may occur with  improper specimen collection/handling, submission of specimen other than nasopharyngeal swab, presence of viral mutation(s) within the areas targeted by this assay, and inadequate number of viral copies (<131 copies/mL). A negative result must be combined with clinical observations, patient history, and epidemiological information. The expected result is Negative. Fact Sheet for Patients:  PinkCheek.be Fact Sheet for Healthcare Providers:  GravelBags.it This test is not yet ap proved or cleared by the [place] FDA and  has been authorized for detection and/or diagnosis of SARS-CoV-2 by FDA under an Emergency Use Authorization (EUA). This EUA will remain  in effect (meaning this test can be used) for the duration of the COVID-19 declaration under Section 564(b)(1) of the Act, 21  U.S.C. section 360bbb-3(b)(1), unless the authorization is terminated or revoked sooner.    Influenza A by PCR NEGATIVE NEGATIVE Final   Influenza B by PCR NEGATIVE NEGATIVE Final    Comment: (NOTE) The Xpert Xpress SARS-CoV-2/FLU/RSV assay is intended as an aid in  the diagnosis of influenza from Nasopharyngeal swab specimens and  should not be used as a sole basis for treatment. Nasal washings and  aspirates are unacceptable for Xpert Xpress SARS-CoV-2/FLU/RSV  testing. Fact Sheet for Patients: PinkCheek.be Fact Sheet  for Healthcare Providers: GravelBags.it This test is not yet approved or cleared by the [place] and  has been authorized for detection and/or diagnosis of SARS-CoV-2 by  FDA under an Emergency Use Authorization (EUA). This EUA will remain  in effect (meaning this test can be used) for the duration of the  Covid-19 declaration under Section 564(b)(1) of the Act, 21  U.S.C. section 360bbb-3(b)(1), unless the authorization is  terminated or revoked. Performed at [facility], [address] [place], [place] [address]   Body fluid culture (includes gram stain)     Status: None   Collection Time: 10/21/19  1:53 PM   Specimen: Pleural Fluid  Result Value Ref Range Status   Specimen Description   Final    PLEURAL Performed at [facility], [address], [place], [place] [address]    Special Requests   Final    NONE Performed at [facility], [address] [place], [place] [address]    Gram Stain   Final    RARE WBC PRESENT, PREDOMINANTLY PMN NO ORGANISMS SEEN    Culture   Final    NO GROWTH 3 DAYS Performed at [facility], [place] [address] [place], [place] [address]    Report Status 10/26/2019 FINAL  Final  CULTURE, BLOOD (ROUTINE X 2) w Reflex to ID Panel     Status: None   Collection Time: 10/21/19 10:04 PM   Specimen: BLOOD  Result Value Ref Range  Status   Specimen Description BLOOD RIGHT ANTECUBITAL  Final   Special Requests   Final    BOTTLES DRAWN AEROBIC AND ANAEROBIC Blood Culture results may not be optimal due to an excessive volume of blood received in culture bottles   Culture   Final    NO GROWTH 5 DAYS Performed at [facility], [address] [place], [place] [address]    Report Status 10/26/2019 FINAL  Final  CULTURE, BLOOD (ROUTINE X 2) w Reflex to ID Panel     Status: None   Collection Time: 10/21/19 10:04 PM   Specimen: BLOOD  Result Value Ref Range Status   Specimen Description BLOOD RIGHT ANTECUBITAL  Final   Special Requests   Final    BOTTLES DRAWN AEROBIC AND ANAEROBIC Blood Culture results may not be optimal due to an excessive volume of blood received in culture bottles   Culture   Final    NO GROWTH 5 DAYS Performed at [facility], [address] [place], [place] [address]    Report Status 10/26/2019 FINAL  Final  MRSA PCR Screening     Status: None   Collection Time: 10/22/19 10:53 PM   Specimen: Nasal Mucosa; Nasopharyngeal  Result Value Ref Range Status   MRSA by PCR NEGATIVE NEGATIVE Final    Comment:        The GeneXpert MRSA Assay (FDA approved for NASAL specimens only), is one component of a comprehensive MRSA colonization surveillance program. It is not intended to diagnose MRSA infection nor to guide or monitor treatment for MRSA infections. Performed at [facility], [address], [place], [place] [address]          Radiology Studies: [facility] Chest [place] 1 View  Result Date: 10/26/2019 CLINICAL DATA:  Status post chest tube placement. EXAM: PORTABLE CHEST 1 VIEW COMPARISON:  10/24/2019 FINDINGS: The cardiomediastinal silhouette is unchanged. A left basilar chest tube remains in place. A small left apical pneumothorax is unchanged. Patchy  left basilar airspace opacity has mildly improved. The right lung remains clear. No sizable pleural effusion is  identified. IMPRESSION: 1. Unchanged small left apical pneumothorax with chest tube in place. 2. Mildly improved left basilar lung aeration. Electronically Signed   By: Logan Bores M.D.   On: 10/26/2019 14:27        Scheduled Meds: . dextromethorphan-guaiFENesin  1 tablet Oral BID  . docusate  200 mg Oral BID  . finasteride  5 mg Oral Daily  . insulin aspart  0-5 Units Subcutaneous QHS  . insulin aspart  0-9 Units Subcutaneous TID WC  . linaclotide  145 mcg Oral q morning - 10a  . magnesium hydroxide  15 mL Oral Daily  . methylPREDNISolone (SOLU-MEDROL) injection  16 mg Intravenous Q24H  . pantoprazole (PROTONIX) IV  40 mg Intravenous Q24H  . predniSONE  20 mg Oral Daily  . Ensure Max Protein  11 oz Oral BID BM  . scopolamine  1 patch Transdermal Q72H  . sodium chloride flush  3 mL Intravenous Once  . Tofacitinib Citrate  5 mg Oral BID   Continuous Infusions:    LOS: 5 days    Time spent: 33 mins    Wyvonnia Dusky, MD [facility] Pager [phone]  If 7PM-7AM, please contact night-coverage [email or web address] Password [facility] 10/27/2019, 8:17 AM

## 2019-10-27 NOTE — Evaluation (Signed)
Occupational Therapy Evaluation Patient Details Name: David Cisneros MRN: [id number] DOB: Jun 17, 1931 Today's Date: 10/27/2019    History of Present Illness Per MD notes: Pt is an 86 y.o. male with medical history significant of former smoker, hypertension, diabetes mellitus, rheumatoid arthritis, chronic back pain, CKD stage IIIa, who presents with shortness of breath.  Patient was found to have a large left-sided pleural effusion by CXR. Left side chest tube was placed in ED.  MD assessment includes: L pleural effusion, hyperkalemia, HTN, chronic back pain, CKD III, leukocytosis, and RA.   Clinical Impression   Pt seen for OT evaluation this date. Prior to hospital admission, pt was Indep with self care and ADL mobility/transfers with use of 4WW in the home and [facility] in community.  Pt lives with spouse in [facility] with 5 STE, pt's spouse with dementia and pt was primary caregiver.  Currently pt demonstrates impairments in fxl activity tolerance-limited by pain and decreased strength of trunk and limbs requiring MAX A for LB ADLs and unable to tolerate t/f on OT assessment d/t c/o pain in L flank (2/2 chest tube site).  Pt would benefit from skilled OT to address noted impairments and functional limitations (see below for any additional details) in order to maximize safety and independence while minimizing falls risk and caregiver burden.  Upon hospital discharge, recommend pt discharge to SNF. Will continue to assess for most appropriate discharge disposition.     Follow Up Recommendations  SNF    Equipment Recommendations  Other (comment)(defer to next level of care)    Recommendations for Other Services       Precautions / Restrictions Precautions Precautions: Fall Precaution Comments: L sided chest tube Restrictions Weight Bearing Restrictions: No      Mobility Bed Mobility Overal bed mobility: Needs Assistance Bed Mobility: Sidelying to Sit;Sit to Sidelying   Sidelying to sit: Min  assist;Mod assist     Sit to sidelying: Min assist;Mod assist General bed mobility comments: OT facilitates MIN verbal/tactile cues for log roll technique to reduce pain  Transfers                 General transfer comment: t/f not assessed at this time, pt reporting too much pain on OT assessment    Balance Overall balance assessment: Needs assistance Sitting-balance support: Bilateral upper extremity supported;Feet supported Sitting balance-Leahy Scale: Good         Standing balance comment: unable to asses at time of OT evaluation 2/2 pain                           ADL either performed or assessed with clinical judgement   ADL Overall ADL's : Needs assistance/impaired Eating/Feeding: Independent;Sitting   Grooming: Wash/dry hands;Wash/dry face;Oral care;Set up;Sitting           Upper Body Dressing : Moderate assistance;Sitting Upper Body Dressing Details (indicate cue type and reason): d/t limited tolerance for L UE use secondary to pain at this time, anticipate higher level Indep with pain controlled Lower Body Dressing: Maximal assistance;Sitting/lateral leans Lower Body Dressing Details (indicate cue type and reason): Pt does not feel he can tolerate stand at this time. Minimal   Toilet Transfer Details (indicate cue type and reason): unable to assess t/f at this time 2/2 pain, MIN/MOD A per PT note from 10/26/19                 Vision Patient Visual Report: No change from  baseline       Perception     Praxis      Pertinent Vitals/Pain Pain Assessment: Faces Faces Pain Scale: Hurts even more Pain Location: L flank with any movement. States that he had a dose of morphine earlier in the day that helped, but has since worn off. Pt states that his nurse is looking into potentially getting percocet for pt. Pain Descriptors / Indicators: Aching;Tender;Tightness Pain Intervention(s): Limited activity within patient's tolerance;Monitored during  session;Patient requesting pain meds-RN notified     Hand Dominance     Extremity/Trunk Assessment Upper Extremity Assessment Upper Extremity Assessment: Generalized weakness;RUE deficits/detail;LUE deficits/detail RUE Deficits / Details: shld, elbow, grip 4/5 LUE Deficits / Details: grossly WFL-completes all arcs of motion, no MMT to shoulder or elbow d/t flank pain. Grip 4/5 LUE: Unable to fully assess due to pain   Lower Extremity Assessment Lower Extremity Assessment: Defer to PT evaluation;Generalized weakness       Communication Communication Communication: No difficulties   Cognition Arousal/Alertness: Awake/alert Behavior During Therapy: WFL for tasks assessed/performed Overall Cognitive Status: Within Functional Limits for tasks assessed                                     General Comments       Exercises Other Exercises Other Exercises: OT facilitates education re: role of OT in acute setting and in general with pt verbalizing understanding. Other Exercises: OT facilitates education re: recommendations for activity to prevent opportunistic infection/issues-encouraging OOB Activity to prevent PNA and skin breakdown. Pt verbalized understanding.   Shoulder Instructions      Home Living Family/patient expects to be discharged to:: Private residence Living Arrangements: Spouse/significant other Available Help at Discharge: Family;Available PRN/intermittently(son lives next door) Type of Home: House Home Access: Stairs to enter [employer] of Steps: 5   Home Layout: One level     Bathroom Shower/Tub: Occupational psychologist: Handicapped height     Home Equipment: [place] - built in;Walker - 4 wheels;Cane - single point   Additional Comments: Pt's spouse has dementia and pt reports that he typically acts as caregiver. States that son lives next door, is retired, but has a lot of property to tend to. Can assist as  needed.      Prior Functioning/Environment Level of Independence: Independent with assistive device(s)        Comments: Pt reports that he typically uses 4WW for fxl mobility within the home, [facility] in community, no recent falls. Reports Ind with all IADLs/ADLs.        OT Problem List: Decreased strength;Decreased activity tolerance;Decreased knowledge of use of DME or AE;Pain      OT Treatment/Interventions: Self-care/ADL training;Therapeutic exercise;Energy conservation;DME and/or AE instruction;Therapeutic activities;Patient/family education;Balance training    OT Goals(Current goals can be found in the care plan section) Acute Rehab OT Goals Patient Stated Goal: To get better and back home OT Goal Formulation: With patient Time For Goal Achievement: 11/10/19 Potential to Achieve Goals: Good  OT Frequency: Min 1X/week   Barriers to D/C:            Co-evaluation              AM-PAC OT "6 Clicks" Daily Activity     Outcome Measure Help from another person eating meals?: None Help from another person taking care of personal grooming?: A Little Help from another person toileting, which  includes using toliet, bedpan, or urinal?: A Little Help from another person bathing (including washing, rinsing, drying)?: A Little Help from another person to put on and taking off regular upper body clothing?: None Help from another person to put on and taking off regular lower body clothing?: A Little 6 Click Score: 20   End of Session    Activity Tolerance: Patient limited by pain Patient left: in bed;with call bell/phone within reach;with bed alarm set  OT Visit Diagnosis: Muscle weakness (generalized) (M62.81);Pain Pain - Right/Left: Left Pain - part of body: (flank-chest tube site)                Time: [id number] OT Time Calculation (min): 38 min Charges:  OT General Charges $OT Visit: 1 Visit OT Evaluation $OT Eval Moderate Complexity: 1 Mod OT Treatments $Self  Care/Home Management : 8-22 mins $Therapeutic Activity: 8-22 mins  Gerrianne Scale, MS, OTR/L ascom [phone] 10/27/19, 1:35 PM

## 2019-10-28 ENCOUNTER — Inpatient Hospital Stay: Payer: Medicare Other

## 2019-10-28 LAB — BASIC METABOLIC PANEL
Anion gap: 10 (ref 5–15)
BUN: 35 mg/dL — ABNORMAL HIGH (ref 8–23)
CO2: 29 mmol/L (ref 22–32)
Calcium: 8.4 mg/dL — ABNORMAL LOW (ref 8.9–10.3)
Chloride: 98 mmol/L (ref 98–111)
Creatinine, Ser: 1.1 mg/dL (ref 0.61–1.24)
GFR calc Af Amer: >60 mL/min (ref >60)
GFR calc non Af Amer: >60 mL/min (ref >60)
Glucose, Bld: 158 mg/dL — ABNORMAL HIGH (ref 70–99)
Potassium: 4.5 mmol/L (ref 3.5–5.1)
Sodium: 137 mmol/L (ref 135–145)

## 2019-10-28 LAB — GLUCOSE, CAPILLARY
Glucose-Capillary: 149 mg/dL — ABNORMAL HIGH (ref 70–99)
Glucose-Capillary: 214 mg/dL — ABNORMAL HIGH (ref 70–99)
Glucose-Capillary: 267 mg/dL — ABNORMAL HIGH (ref 70–99)
Glucose-Capillary: 216 mg/dL — ABNORMAL HIGH (ref 70–99)

## 2019-10-28 LAB — MAGNESIUM: Magnesium: 2 mg/dL (ref 1.7–2.4)

## 2019-10-28 LAB — CBC
HCT: 31.9 % — ABNORMAL LOW (ref 39.0–52.0)
Hemoglobin: 10.2 g/dL — ABNORMAL LOW (ref 13.0–17.0)
MCH: 29.5 pg (ref 26.0–34.0)
MCHC: 32 g/dL (ref 30.0–36.0)
MCV: 92.2 fL (ref 80.0–100.0)
Platelets: 424 10*3/uL — ABNORMAL HIGH (ref 150–400)
RBC: 3.46 MIL/uL — ABNORMAL LOW (ref 4.22–5.81)
RDW: 12.9 % (ref 11.5–15.5)
WBC: 15.4 10*3/uL — ABNORMAL HIGH (ref 4.0–10.5)
nRBC: 0.3 % — ABNORMAL HIGH (ref 0.0–0.2)

## 2019-10-28 LAB — PHOSPHORUS: Phosphorus: 3.1 mg/dL (ref 2.5–4.6)

## 2019-10-28 MED ORDER — MAGNESIUM HYDROXIDE 400 MG/5ML PO SUSP
30.0000 mL | Freq: Every day | ORAL | Status: DC
  Administered 2019-10-28 – 2019-11-02 (×5): 30 mL via ORAL
  Filled 2019-10-28 (×7): qty 30

## 2019-10-28 MED ORDER — ADULT MULTIVITAMIN W/MINERALS CH
1.0000 | ORAL_TABLET | Freq: Every day | ORAL | Status: DC
  Administered 2019-10-29 – 2019-11-03 (×6): 1 via ORAL
  Filled 2019-10-28 (×6): qty 1

## 2019-10-28 MED ORDER — BISACODYL 10 MG RE SUPP
10.0000 mg | Freq: Once | RECTAL | Status: DC
  Filled 2019-10-28: qty 1

## 2019-10-28 NOTE — Progress Notes (Signed)
Physical Therapy Treatment Patient Details Name: David Cisneros MRN: [id number] DOB: 28-Jun-1931 Today's Date: 10/28/2019    History of Present Illness Per MD notes: Pt is an 86 y.o. male with medical history significant of former smoker, hypertension, diabetes mellitus, rheumatoid arthritis, chronic back pain, CKD stage IIIa, who presents with shortness of breath.  Patient was found to have a large left-sided pleural effusion by CXR. Left side chest tube was placed in ED.  MD assessment includes: L pleural effusion, hyperkalemia, HTN, chronic back pain, CKD III, leukocytosis, and RA.    PT Comments    Pt resting in bed upon PT arrival.  Pt initially laughed when therapist introduced herself and discussed potential therapy activities and then pt refusing any PT activities.  Pt appearing with flat affect and decreased mood. Therapist asked pt if he would like to speak to a chaplain but pt refused.  Nurse notified of pt's behavior and decreased mood (nurse already aware).  Able to encourage pt to participate in LE ex's in bed.  Pt demonstrating overall generalized weakness but appeared to tolerate these ex's fairly well.  Pain 4/10 L chest tube site end of session.  Will continue to focus on strengthening and progressive functional mobility per pt tolerance.   Follow Up Recommendations  SNF     Equipment Recommendations  Rolling walker with 5" wheels;3in1 (PT)    Recommendations for Other Services       Precautions / Restrictions Precautions Precautions: Fall Precaution Comments: L sided chest tube Restrictions Weight Bearing Restrictions: No    Mobility  Bed Mobility               General bed mobility comments: Pt refused OOB mobility  Transfers                 General transfer comment: Pt refused OOB mobility  Ambulation/Gait             General Gait Details: Pt refused OOB mobility   Stairs             Wheelchair Mobility    Modified Rankin  (Stroke Patients Only)       Balance                                            Cognition Arousal/Alertness: Awake/alert Behavior During Therapy: Flat affect Overall Cognitive Status: Within Functional Limits for tasks assessed                                 General Comments: Pt appearing with decreased mood      Exercises Total Joint Exercises (all semi-supine in bed) Ankle Circles/Pumps: AROM;Strengthening;Both;Supine(10 reps x2) Quad Sets: AROM;Strengthening;Both;Supine(10 reps x2) Short Arc Quad: AAROM;AROM;Strengthening;Both;Supine(10 reps x2) Heel Slides: AAROM;Strengthening;Both;Supine(10 reps x2) Hip ABduction/ADduction: AAROM;Strengthening;Both;Supine(10 reps x2)    General Comments General comments (skin integrity, edema, etc.): No drainage noted on chest tube dressing.  Nursing cleared pt for participation in physical therapy.  Pt agreeable to limited PT session with encouragement.      Pertinent Vitals/Pain Pain Assessment: 0-10 Pain Score: 4  Pain Location: L chest tube site Pain Descriptors / Indicators: Aching;Discomfort;Sore;Tender Pain Intervention(s): Limited activity within patient's tolerance;Monitored during session;Repositioned  Vitals stable and [facility]    Home Living  Prior Function            PT Goals (current goals can now be found in the care plan section) Acute Rehab PT Goals Patient Stated Goal: To get better and back home PT Goal Formulation: With patient Time For Goal Achievement: 11/04/2019 Potential to Achieve Goals: Fair Progress towards PT goals: Progressing toward goals(with LE ex's)    Frequency    Min 2X/week      PT Plan Current plan remains appropriate    Co-evaluation              AM-PAC PT "6 Clicks" Mobility   Outcome Measure  Help needed turning from your back to your side while in a flat bed without using bedrails?: A Lot Help needed moving from  lying on your back to sitting on the side of a flat bed without using bedrails?: A Lot Help needed moving to and from a bed to a chair (including a wheelchair)?: A Lot Help needed standing up from a chair using your arms (e.g., wheelchair or bedside chair)?: A Lot Help needed to walk in hospital room?: A Lot Help needed climbing 3-5 steps with a railing? : Total 6 Click Score: 11    End of Session   Activity Tolerance: Patient limited by fatigue Patient left: in bed;with call bell/phone within reach;with bed alarm set Nurse Communication: Mobility status;Precautions;Other (comment)(Pt's decreased mood) PT Visit Diagnosis: Unsteadiness on feet (R26.81);Muscle weakness (generalized) (M62.81);Difficulty in walking, not elsewhere classified (R26.2)     Time: [id number] PT Time Calculation (min) (ACUTE ONLY): 20 min  Charges:  $Therapeutic Exercise: 8-22 mins                     Leitha Bleak, PT 10/28/19, 12:06 PM

## 2019-10-28 NOTE — Progress Notes (Signed)
PROGRESS NOTE    David Cisneros  [id number] DOB: 09-23-1931 DOA: 10/21/2019 PCP: Valera Castle, MD   Brief Narrative:  David Cisneros a 86 y.o.malewith medical history significant offormer smoker, hypertension, diabetes mellitus, rheumatoid arthritis, chronic back pain, CKD stage IIIa, who presents with shortness of breath. Found to have large left-sided pleural effusion.  Chest tube was placed by cardiothoracic surgery. Thought to be due to rheumatoid arthritis. Also developed ileus-initially managed with NG tube which was pulled out by patient on 10/24/2019.  Subjective: Patient was feeling better when seen this morning.  He was accompanied by his son in the room.  Denies any chest pain or shortness of breath.  Did not had any bowel movement yet.  No nausea or vomiting.  Assessment & Plan:   Principal Problem:   Pleural effusion on left Active Problems:   Hyperkalemia   Hypertension   Chronic back pain   CKD (chronic kidney disease), stage IIIa   Leukocytosis   Pneumothorax on left   Rheumatoid arthritis (HCC)   Malnutrition of moderate degree  Left pleural effusion:Etiology unclear, likely secondary to complication from RA as per intensvist. S/p left chest tube placement. Per EDP,approximately 3 L of bloody fluid were removed. Pleural fluid analysis is lymphocyte predominant, cytology is neg for malignancy, Rheumatoid factors pending. Pleural fluid cx NGTD. Morphine prn for pain. Pulmon following and recs apprec   Pneumothorax on left: small. S/p left chest tube & draining bloody fluid. W/ air leak present  as per surg. Remained unchanged on repeat chest x-ray today.  Managed as per surgery.   Hyperkalemia: resolved.  Will continue to monitor   Ileus: etiology unclear. Pt pulled out NG tube 10/24/19 and the NG tube was left out. Evidently pt is passing gas but still no bowel movement yet. Continue on colace, dulcolax, milk of Mg & will give polyethylene  glycol x1 today. No bowel movements yet. Possible bloody bilious vomiting on evening on 10/22/19.  H&H are stable. GI signed off. -Increase the dose of milk of magnesia. -Give one-time dose of Dulcolax suppository can repeat if needed. -Continue with other bowel regimen..  Rheumatoid arthritis: at home onXeljanz and prednisone. He was initially treated with IV Solu-Medrol. -Start him on increased dose of prednisone 20 mg daily.  AKI on CKDIIIa. Improved to 1.1 today. -Continue to monitor.  Hypertension.  Not on any antihypertensives. Currently normotensive. -Monitor with as needed hydralazine.  Chronic back pain. -Continue morphine as needed.  Leukocytosis.  Most likely secondary to steroid use. Patient remained afebrile. -Continue monitoring.  Generalized weakness.  PT recommending SNF. -Waiting for bed placement.  Objective: Vitals:   10/27/19 2055 10/28/19 0406 10/28/19 1136 10/28/19 1509  BP: 134/77 (!) 155/84 128/66   Pulse: 85 100 (!) 107   Resp: (!) 22 (!) 22 18   Temp: 98.8 F (37.1 C) 98.9 F (37.2 C) 98 F (36.7 C)   TempSrc: Oral Oral Oral   SpO2: 96% 92% 96%   Weight:    67.1 kg  Height:        Intake/Output Summary (Last 24 hours) at 10/28/2019 1900 Last data filed at 10/28/2019 1749 Gross per 24 hour  Intake 0 ml  Output 1100 ml  Net -1100 ml   Filed Weights   10/21/19 1050 10/22/19 2241 10/28/19 1509  Weight: 72.6 kg 67.5 kg 67.1 kg    Examination:  General exam: Appears calm and comfortable  Respiratory system: Clear to auscultation.  Left-sided chest tube  in place respiratory effort normal. Cardiovascular system: S1 & S2 heard, RRR. No JVD, murmurs, rubs, gallops or clicks. Gastrointestinal system: Soft, nontender, nondistended, bowel sounds positive. Central nervous system: Alert and oriented. No focal neurological deficits.Symmetric 5 x 5 power. Extremities: No edema, no cyanosis, pulses intact and symmetrical. Skin: No rashes, lesions  or ulcers Psychiatry: Judgement and insight appear normal. Mood & affect appropriate.    DVT prophylaxis: SCDs Code Status: DNR Family Communication: Son was updated at bedside Disposition Plan: Pending improvement and SNF bed availability.  Consultants:   Cardiothoracic surgery  GI  Procedures:   S/p chest tube placement on 10/21/19; s/p NG tube placement on 10/22/19 & pt pulled out NG tube 10/24/19   Antimicrobials:   Data Reviewed: I have personally reviewed following labs and imaging studies  CBC: Recent Labs  Lab 10/22/19 1946 10/24/19 0559 10/25/19 0542 10/26/19 0527 10/27/19 0551 10/28/19 0621  WBC 15.5* 12.7* 11.7* 14.4* 12.6* 15.4*  NEUTROABS 14.5*  --   --   --   --   --   HGB 10.3* 9.9* 10.0* 10.3* 10.3* 10.2*  HCT 32.6* 29.8* 32.0* 30.5* 32.2* 31.9*  MCV 92.9 89.0 92.8 87.6 91.5 92.2  PLT 571* 508* 525* 518* 474* 123456*   Basic Metabolic Panel: Recent Labs  Lab 10/24/19 0559 10/25/19 0542 10/26/19 0527 10/27/19 0551 10/28/19 0621  NA 137 136 136 137 137  K 5.0 4.5 4.5 3.8 4.5  CL 92* 92* 93* 96* 98  CO2 29 31 31 29 29   GLUCOSE 175* 184* 149* 161* 158*  BUN 52* 49* 38* 44* 35*  CREATININE 1.49* 1.17 1.21 1.13 1.10  CALCIUM 8.4* 8.5* 8.8* 8.4* 8.4*  MG 2.8* 2.6* 2.2 2.2 2.0  PHOS 5.2* 3.6 3.4 3.3 3.1   GFR: Estimated Creatinine Clearance: 41.9 mL/min (by C-G formula based on SCr of 1.1 mg/dL). Liver Function Tests: Recent Labs  Lab 10/22/19 1946 10/24/19 0559  AST 16 14*  ALT 27 20  ALKPHOS 59 53  BILITOT 0.7 0.9  PROT 6.2* 6.1*  ALBUMIN 2.7* 2.7*   Recent Labs  Lab 10/22/19 1946  LIPASE 77*   No results for input(s): AMMONIA in the last 168 hours. Coagulation Profile: Recent Labs  Lab 10/22/19 1946  INR 1.2   Cardiac Enzymes: No results for input(s): CKTOTAL, CKMB, CKMBINDEX, TROPONINI in the last 168 hours. BNP (last 3 results) No results for input(s): PROBNP in the last 8760 hours. HbA1C: No results for input(s):  HGBA1C in the last 72 hours. CBG: Recent Labs  Lab 10/27/19 1627 10/27/19 2109 10/28/19 0807 10/28/19 1136 10/28/19 1650  GLUCAP 210* 292* 149* 214* 267*   Lipid Profile: No results for input(s): CHOL, HDL, LDLCALC, TRIG, CHOLHDL, LDLDIRECT in the last 72 hours. Thyroid Function Tests: No results for input(s): TSH, T4TOTAL, FREET4, T3FREE, THYROIDAB in the last 72 hours. Anemia Panel: No results for input(s): VITAMINB12, FOLATE, FERRITIN, TIBC, IRON, RETICCTPCT in the last 72 hours. Sepsis Labs: No results for input(s): PROCALCITON, LATICACIDVEN in the last 168 hours.  Recent Results (from the past 240 hour(s))  Respiratory Panel by RT PCR (Flu A&B, Covid) - Nasopharyngeal Swab     Status: None   Collection Time: 10/21/19 12:33 PM   Specimen: Nasopharyngeal Swab  Result Value Ref Range Status   SARS Coronavirus 2 by RT PCR NEGATIVE NEGATIVE Final    Comment: (NOTE) SARS-CoV-2 target nucleic acids are NOT DETECTED. The SARS-CoV-2 RNA is generally detectable in upper respiratoy specimens during the acute  phase of infection. The lowest concentration of SARS-CoV-2 viral copies this assay can detect is 131 copies/mL. A negative result does not preclude SARS-Cov-2 infection and should not be used as the sole basis for treatment or other patient management decisions. A negative result may occur with  improper specimen collection/handling, submission of specimen other than nasopharyngeal swab, presence of viral mutation(s) within the areas targeted by this assay, and inadequate number of viral copies (<131 copies/mL). A negative result must be combined with clinical observations, patient history, and epidemiological information. The expected result is Negative. Fact Sheet for Patients:  PinkCheek.be Fact Sheet for Healthcare Providers:  GravelBags.it This test is not yet ap proved or cleared by the [place] FDA and    has been authorized for detection and/or diagnosis of SARS-CoV-2 by FDA under an Emergency Use Authorization (EUA). This EUA will remain  in effect (meaning this test can be used) for the duration of the COVID-19 declaration under Section 564(b)(1) of the Act, 21 U.S.C. section 360bbb-3(b)(1), unless the authorization is terminated or revoked sooner.    Influenza A by PCR NEGATIVE NEGATIVE Final   Influenza B by PCR NEGATIVE NEGATIVE Final    Comment: (NOTE) The Xpert Xpress SARS-CoV-2/FLU/RSV assay is intended as an aid in  the diagnosis of influenza from Nasopharyngeal swab specimens and  should not be used as a sole basis for treatment. Nasal washings and  aspirates are unacceptable for Xpert Xpress SARS-CoV-2/FLU/RSV  testing. Fact Sheet for Patients: PinkCheek.be Fact Sheet for Healthcare Providers: GravelBags.it This test is not yet approved or cleared by the [place] FDA and  has been authorized for detection and/or diagnosis of SARS-CoV-2 by  FDA under an Emergency Use Authorization (EUA). This EUA will remain  in effect (meaning this test can be used) for the duration of the  Covid-19 declaration under Section 564(b)(1) of the Act, 21  U.S.C. section 360bbb-3(b)(1), unless the authorization is  terminated or revoked. Performed at [facility], [address] [place], [place] [address]   Body fluid culture (includes gram stain)     Status: None   Collection Time: 10/21/19  1:53 PM   Specimen: Pleural Fluid  Result Value Ref Range Status   Specimen Description   Final    PLEURAL Performed at [facility], [address], [place], [place] [address]    Special Requests   Final    NONE Performed at [facility], [address] [place], [place] [address]    Gram Stain   Final    RARE WBC PRESENT, PREDOMINANTLY PMN NO ORGANISMS SEEN    Culture   Final    NO GROWTH 3  DAYS Performed at [facility], [address] [place], [place] [address]    Report Status 10/26/2019 FINAL  Final  CULTURE, BLOOD (ROUTINE X 2) w Reflex to ID Panel     Status: None   Collection Time: 10/21/19 10:04 PM   Specimen: BLOOD  Result Value Ref Range Status   Specimen Description BLOOD RIGHT ANTECUBITAL  Final   Special Requests   Final    BOTTLES DRAWN AEROBIC AND ANAEROBIC Blood Culture results may not be optimal due to an excessive volume of blood received in culture bottles   Culture   Final    NO GROWTH 5 DAYS Performed at [facility], [address] [place], [place] [address]    Report Status 10/26/2019 FINAL  Final  CULTURE, BLOOD (ROUTINE X 2) w  Reflex to ID Panel     Status: None   Collection Time: 10/21/19 10:04 PM   Specimen: BLOOD  Result Value Ref Range Status   Specimen Description BLOOD RIGHT ANTECUBITAL  Final   Special Requests   Final    BOTTLES DRAWN AEROBIC AND ANAEROBIC Blood Culture results may not be optimal due to an excessive volume of blood received in culture bottles   Culture   Final    NO GROWTH 5 DAYS Performed at [facility], [address] [place], [place] [address]    Report Status 10/26/2019 FINAL  Final  MRSA PCR Screening     Status: None   Collection Time: 10/22/19 10:53 PM   Specimen: Nasal Mucosa; Nasopharyngeal  Result Value Ref Range Status   MRSA by PCR NEGATIVE NEGATIVE Final    Comment:        The GeneXpert MRSA Assay (FDA approved for NASAL specimens only), is one component of a comprehensive MRSA colonization surveillance program. It is not intended to diagnose MRSA infection nor to guide or monitor treatment for MRSA infections. Performed at [facility], [address] [place], [place] [address]      Radiology Studies: DG Chest 2 View  Result Date: 10/28/2019 CLINICAL DATA:  Left chest tube EXAM: CHEST - 2 VIEW COMPARISON:  Two days ago FINDINGS: Left basal chest tube  in stable position. Small left pneumothorax measuring 10-20%, seen at the apex, medially, and at the base. The right lung is clear. Stable heart size. IMPRESSION: 10-20% left pneumothorax seen at the apex and base. No definite change from 2 days ago. Electronically Signed   By: Monte Fantasia M.D.   On: 10/28/2019 08:19    Scheduled Meds: . dextromethorphan-guaiFENesin  1 tablet Oral BID  . docusate  200 mg Oral BID  . finasteride  5 mg Oral Daily  . insulin aspart  0-5 Units Subcutaneous QHS  . insulin aspart  0-9 Units Subcutaneous TID WC  . insulin aspart  4 Units Subcutaneous TID WC  . linaclotide  145 mcg Oral q morning - 10a  . magnesium hydroxide  15 mL Oral Daily  . [START ON ] multivitamin with minerals  1 tablet Oral Daily  . pantoprazole (PROTONIX) IV  40 mg Intravenous Q24H  . predniSONE  20 mg Oral Daily  . Ensure Max Protein  11 oz Oral BID BM  . scopolamine  1 patch Transdermal Q72H  . sodium chloride flush  3 mL Intravenous Once  . Tofacitinib Citrate  5 mg Oral BID   Continuous Infusions:   LOS: 6 days   Time spent: 35 min  Lorella Nimrod, MD [facility] Pager [phone]  If 7PM-7AM, please contact night-coverage [email or web address] Password [email or web address] 10/28/2019, 7:00 PM   This record has been created using Systems analyst. Errors have been sought and corrected,but may not always be located. Such creation errors do not reflect on the standard of care.

## 2019-10-28 NOTE — Progress Notes (Addendum)
Nutrition Follow Up Note   DOCUMENTATION CODES:   Non-severe (moderate) malnutrition in context of social or environmental circumstances  INTERVENTION:   Provide Ensure Max Protein po BID, each supplement provides 150 kcal and 30 grams of protein. Patient prefers chocolate.  MVI daily   NUTRITION DIAGNOSIS:   Moderate Malnutrition related to social / environmental circumstances(inadequate oral intake related to chronic abdominal pain and constipation) as evidenced by mild fat depletion, mild muscle depletion, moderate muscle depletion, per patient/family report.  GOAL:   Patient will meet greater than or equal to 90% of their needs  -progressing   MONITOR:   PO intake, Supplement acceptance, Diet advancement, Labs, Weight trends, I & O's  ASSESSMENT:   86 year old male with PMHx of DM, RA, HTN, chronic back pain admitted with left pleural effusion and left pneumothorax s/p placement of chest tube, also with ileus and abdominal pain.   Spoke with pt via phone. Pt reports improving appetite and oral intake. Pt reports eating 50% of his lunch today which included pot roast, mashed potatoes and gravy. Pt also reports drinking 100% of his supplements. Pt is passing flatus but has not yet had a BM. Pt denies any nausea or vomiting. No new weight since admit; will request weekly weights.   Chest tube in place with 127ml output.   Medications reviewed and include: colace, insulin, protonix, prednisone   Labs reviewed: K 4.5 wnl, P 3.1 wnl, Mg 2.0 wnl Wbc- 15.4(H), Hgb 10.2(L), Hct 31.9(L) cbgs- 149, 214 x 24 hrs  Diet Order:   Diet Order            DIET SOFT Room service appropriate? Yes; Fluid consistency: Thin  Diet effective now             EDUCATION NEEDS:   No education needs have been identified at this time  Skin:  Skin Assessment: Reviewed RN Assessment  Last BM:  12/29  Height:   Ht Readings from Last 1 Encounters:  10/22/19 5\' 5"  (1.651 m)   Weight:    Wt Readings from Last 1 Encounters:  10/22/19 67.5 kg   Ideal Body Weight:  61.8 kg  BMI:  Body mass index is 24.76 kg/m.  Estimated Nutritional Needs:   Kcal:  1700-1900  Protein:  85-95 grams  Fluid:  1.7-1.9 L/day  Koleen Distance MS, RD, LDN Pager #- [phone] Office#- [phone] After Hours Pager: [phone]

## 2019-10-28 NOTE — TOC Initial Note (Signed)
Transition of Care [facility]) - Initial/Assessment Note    Patient Details  Name: David Cisneros MRN: [id number] Date of Birth: [birth year]  [facility] CM/SW Contact:    Shelbie Ammons, RN Phone Number: 10/28/2019, 1:23 PM  Clinical Narrative:                  RNCM to bedside for high risk assessment as well as discussion of possible SNF placement. Patient initially asleep however was easily aroused. Patient was admitted to hospital after being sent from MD office due to shortness of breath. He was found to have large left pleural effusion and still has chest tubes in place. Patient lives in single level home with several steps to enter. He is the primary caregiver for his wife who has advanced Dementia. They live on same property as their son and have good family support. Patient reports that he has plenty of equipment in the home and are not in need of anything. Discussed therapies recommendation of SNF for rehab after d/c which after discussion patient is agreeable to however he does request that this CM contact his son.  Placed call to son Louie Casa and left VM for return call. Louie Casa returned call and we discussed current status of patient as well as probable need for SNF for which he is agreeable to.  Will complete PASSR, FL2 and make bed requests.    Expected Discharge Plan: Skilled Nursing Facility Barriers to Discharge: No Barriers Identified   Patient Goals and CMS Choice     Choice offered to / list presented to : Patient, Adult Children  Expected Discharge Plan and Services Expected Discharge Plan: [place]   Discharge Planning Services: CM Consult Post Acute Care Choice: [place] Living arrangements for the past 2 months: Single Family Home                                      Prior Living Arrangements/Services Living arrangements for the past 2 months: Single Family Home Lives with:: Spouse Patient language and need for  interpreter reviewed:: Yes Do you feel safe going back to the place where you live?: Yes      Need for Family Participation in Patient Care: Yes (Comment) Care giver support system in place?: Yes (comment)   Criminal Activity/Legal Involvement Pertinent to Current Situation/Hospitalization: No - Comment as needed  Activities of Daily Living Home Assistive Devices/Equipment: Walker (specify type) ADL Screening (condition at time of admission) Patient's cognitive ability adequate to safely complete daily activities?: Yes Is the patient deaf or have difficulty hearing?: Yes Does the patient have difficulty seeing, even when wearing glasses/contacts?: Yes Does the patient have difficulty concentrating, remembering, or making decisions?: Yes Patient able to express need for assistance with ADLs?: Yes Does the patient have difficulty dressing or bathing?: Yes Independently performs ADLs?: Yes (appropriate for developmental age) Does the patient have difficulty walking or climbing stairs?: Yes Weakness of Legs: Both Weakness of Arms/Hands: Both  Permission Sought/Granted Permission sought to share information with : Family Supports    Share Information with NAME: Oval Linsey           Emotional Assessment Appearance:: Appears stated age Attitude/Demeanor/Rapport: Engaged Affect (typically observed): Calm Orientation: : Oriented to Self, Oriented to Place, Oriented to  Time, Oriented to Situation Alcohol / Substance Use: Never Used Psych Involvement: No (comment)  Admission diagnosis:  Acute  cholecystitis [K81.0] Pleural effusion on left [J90] Pleural effusion, left [J90] Patient Active Problem List   Diagnosis Date Noted  . Malnutrition of moderate degree 10/26/2019  . Pleural effusion on left 10/21/2019  . Hyperkalemia 10/21/2019  . Pneumothorax on left 10/21/2019  . Rheumatoid arthritis ([place]) 10/21/2019  . Hypertension   . Chronic back pain   . CKD (chronic kidney  disease), stage IIIa   . Leukocytosis    PCP:  Valera Castle, MD Pharmacy:   [id number] - [place], [place] [address] Phone: [phone] Fax: [phone]     Social Determinants of Health (SDOH) Interventions    Readmission Risk Interventions Readmission Risk Prevention Plan 10/28/2019  Transportation Screening Complete  PCP or Specialist Appt within 3-5 Days Complete  Palliative Care Screening Not Applicable  Medication Review (RN Care Manager) Complete  Some recent data might be hidden

## 2019-10-28 NOTE — Progress Notes (Signed)
Patient ID: David Cisneros, male   DOB: January 15, 1931, 86 y.o.   MRN: [id number]  Patient ID: David Cisneros, male   DOB: 1931/05/09, 86 y.o.   MRN: [id number]  HISTORY: Not short of breath.  Too weak to get out of bed and walk.  Unable to do much of anything because he is too weak.   Vitals:   10/27/19 2055 10/28/19 0406  BP: 134/77 (!) 155/84  Pulse: 85 100  Resp: (!) 22 (!) 22  Temp: 98.8 F (37.1 C) 98.9 F (37.2 C)  SpO2: 96% 92%     EXAM:    Resp: Lungs are clear bilaterally.  No respiratory distress, normal effort. Heart:  Regular without murmurs Abd:  Abdomen is soft, non distended and non tender. No masses are palpable.  There is no rebound and no guarding.  Neurological: Alert and oriented to person, place, and time. Coordination normal.  Skin: Skin is warm and dry. No rash noted. No diaphoretic. No erythema. No pallor.  Psychiatric: Normal mood and affect. Normal behavior. Judgment and thought content normal.   Chest tube with small air leak  ASSESSMENT: Cytology negative.     PLAN:   Will leave chest tube on water seal. Repeat CXray today.   Consider CT of chest to assess cause of pneumothorax and pleural effusion    Nestor Lewandowsky, MD

## 2019-10-28 NOTE — NC FL2 (Signed)
[place] LEVEL OF CARE SCREENING TOOL     IDENTIFICATION  Patient Name: David Cisneros Birthdate: 1931-01-31 Sex: male Admission Date (Current Location): 10/21/2019  [place] and [place] Number:      Facility and Address:  [facility], [address], [place], [place] [address]      Provider Number: [id number]  Attending Physician Name and Address:  Lorella Nimrod, MD  Relative Name and Phone Number:  Aneil Angelini [phone]    Current Level of Care: Hospital Recommended Level of Care: [place] Prior Approval Number:    Date Approved/Denied:   PASRR Number: [id number]  Discharge Plan: SNF    Current Diagnoses: Patient Active Problem List   Diagnosis Date Noted  . Malnutrition of moderate degree 10/26/2019  . Pleural effusion on left 10/21/2019  . Hyperkalemia 10/21/2019  . Pneumothorax on left 10/21/2019  . Rheumatoid arthritis ([place]) 10/21/2019  . Hypertension   . Chronic back pain   . CKD (chronic kidney disease), stage IIIa   . Leukocytosis     Orientation RESPIRATION BLADDER Height & Weight     Self, Time, Situation, Place  Normal Continent Weight: 67.5 kg Height:  5\' 5"  (165.1 cm)  BEHAVIORAL SYMPTOMS/MOOD NEUROLOGICAL BOWEL NUTRITION STATUS      Continent Diet(Soft, thin liquids)  AMBULATORY STATUS COMMUNICATION OF NEEDS Skin   Limited Assist Verbally Surgical wounds(chest tube insertion site)                       Personal Care Assistance Level of Assistance  Bathing, Dressing Bathing Assistance: Limited assistance Feeding assistance: Limited assistance Dressing Assistance: Limited assistance     Functional Limitations Info             SPECIAL CARE FACTORS FREQUENCY  OT (By licensed OT), PT (By licensed PT)                    Contractures Contractures Info: Not present    Additional Factors Info  Code Status, Allergies Code Status Info: DNR Allergies Info: Sulfa           Current Medications (10/28/2019):  This is the current hospital active medication list Current Facility-Administered Medications  Medication Dose Route Frequency Provider Last Rate Last Admin  . acetaminophen (TYLENOL) tablet 650 mg  650 mg Oral Q6H PRN Ivor Costa, MD      . albuterol (VENTOLIN HFA) 108 (90 Base) MCG/ACT inhaler 2 puff  2 puff Inhalation Q4H PRN Ivor Costa, MD      . bisacodyl (DULCOLAX) EC tablet 5 mg  5 mg Oral Daily PRN Wyvonnia Dusky, MD      . dextromethorphan-guaiFENesin [facility] DM) 30-600 MG per 12 hr tablet 1 tablet  1 tablet Oral BID Ivor Costa, MD   1 tablet at 10/28/19 1032  . docusate (COLACE) 50 MG/5ML liquid 200 mg  200 mg Oral BID Wyvonnia Dusky, MD   200 mg at 10/27/19 2100  . finasteride (PROSCAR) tablet 5 mg  5 mg Oral Daily Ivor Costa, MD   5 mg at 10/28/19 1032  . hydrALAZINE (APRESOLINE) injection 20 mg  20 mg Intravenous Q6H PRN Wyvonnia Dusky, MD      . insulin aspart (novoLOG) injection 0-5 Units  0-5 Units Subcutaneous QHS Ivor Costa, MD   3 Units at 10/27/19 2201  . insulin aspart (novoLOG) injection 0-9 Units  0-9 Units Subcutaneous TID WC Ivor Costa, MD   3  Units at 10/28/19 1233  . insulin aspart (novoLOG) injection 4 Units  4 Units Subcutaneous TID WC Wyvonnia Dusky, MD   4 Units at 10/28/19 1233  . linaclotide (LINZESS) capsule 145 mcg  145 mcg Oral q morning - 10a Ivor Costa, MD   145 mcg at 10/28/19 1032  . magnesium hydroxide (MILK OF MAGNESIA) suspension 15 mL  15 mL Oral Daily Wyvonnia Dusky, MD   15 mL at 10/28/19 1031  . morphine 2 MG/ML injection 2 mg  2 mg Intravenous Q2H PRN Wyvonnia Dusky, MD   2 mg at 10/28/19 1031  . morphine 4 MG/ML injection 4 mg  4 mg Intravenous Q3H PRN Wyvonnia Dusky, MD      . ondansetron [facility]) injection 4 mg  4 mg Intravenous Q8H PRN Ivor Costa, MD   4 mg at 10/23/19 0912  . pantoprazole (PROTONIX) injection 40 mg  40 mg Intravenous Q24H Wyvonnia Dusky, MD   40  mg at 10/28/19 0839  . polyethylene glycol (MIRALAX / GLYCOLAX) packet 17 g  17 g Oral Daily PRN Ivor Costa, MD      . predniSONE (DELTASONE) tablet 20 mg  20 mg Oral Daily Wyvonnia Dusky, MD   20 mg at 10/28/19 1032  . protein supplement (ENSURE MAX) liquid  11 oz Oral BID BM Wyvonnia Dusky, MD   11 oz at 10/28/19 1031  . scopolamine (TRANSDERM-SCOP) 1 MG/3DAYS 1.5 mg  1 patch Transdermal Q72H Wyvonnia Dusky, MD   1.5 mg at 10/25/19 1642  . sodium chloride flush (NS) 0.9 % injection 3 mL  3 mL Intravenous Once Ivor Costa, MD      . Tofacitinib Citrate TABS 5 mg  5 mg Oral BID Ivor Costa, MD   Stopped at 10/25/19 [phone]     Discharge Medications: Please see discharge summary for a list of discharge medications.  Relevant Imaging Results:  Relevant Lab Results:   Additional Information    Shelbie Ammons, RN

## 2019-10-29 ENCOUNTER — Inpatient Hospital Stay: Payer: Medicare Other

## 2019-10-29 LAB — CBC
HCT: 33.5 % — ABNORMAL LOW (ref 39.0–52.0)
Hemoglobin: 10.7 g/dL — ABNORMAL LOW (ref 13.0–17.0)
MCH: 29.6 pg (ref 26.0–34.0)
MCHC: 31.9 g/dL (ref 30.0–36.0)
MCV: 92.5 fL (ref 80.0–100.0)
Platelets: 380 10*3/uL (ref 150–400)
RBC: 3.62 MIL/uL — ABNORMAL LOW (ref 4.22–5.81)
RDW: 13.1 % (ref 11.5–15.5)
WBC: 15.5 10*3/uL — ABNORMAL HIGH (ref 4.0–10.5)
nRBC: 0.3 % — ABNORMAL HIGH (ref 0.0–0.2)

## 2019-10-29 LAB — GLUCOSE, CAPILLARY
Glucose-Capillary: 166 mg/dL — ABNORMAL HIGH (ref 70–99)
Glucose-Capillary: 324 mg/dL — ABNORMAL HIGH (ref 70–99)
Glucose-Capillary: 187 mg/dL — ABNORMAL HIGH (ref 70–99)
Glucose-Capillary: 307 mg/dL — ABNORMAL HIGH (ref 70–99)

## 2019-10-29 LAB — BASIC METABOLIC PANEL
Anion gap: 12 (ref 5–15)
BUN: 39 mg/dL — ABNORMAL HIGH (ref 8–23)
CO2: 27 mmol/L (ref 22–32)
Calcium: 8.4 mg/dL — ABNORMAL LOW (ref 8.9–10.3)
Chloride: 99 mmol/L (ref 98–111)
Creatinine, Ser: 1.07 mg/dL (ref 0.61–1.24)
GFR calc Af Amer: >60 mL/min (ref >60)
GFR calc non Af Amer: >60 mL/min (ref >60)
Glucose, Bld: 156 mg/dL — ABNORMAL HIGH (ref 70–99)
Potassium: 4.4 mmol/L (ref 3.5–5.1)
Sodium: 138 mmol/L (ref 135–145)

## 2019-10-29 LAB — RHEUMATOID FACTORS, FLUID

## 2019-10-29 MED ORDER — INSULIN ASPART 100 UNIT/ML ~~LOC~~ SOLN: 6.0000 [IU] | Freq: Three times a day (TID) | SUBCUTANEOUS | Status: DC

## 2019-10-29 MED ORDER — MAGNESIUM HYDROXIDE 400 MG/5ML PO SUSP
30.0000 mL | Freq: Once | ORAL | Status: AC
  Administered 2019-10-29: 30 mL via ORAL
  Filled 2019-10-29: qty 30

## 2019-10-29 MED ORDER — DOCUSATE SODIUM 100 MG PO CAPS
200.0000 mg | ORAL_CAPSULE | Freq: Two times a day (BID) | ORAL | Status: DC
  Administered 2019-10-29 – 2019-11-02 (×8): 200 mg via ORAL
  Filled 2019-10-29 (×7): qty 2

## 2019-10-29 NOTE — Progress Notes (Signed)
  Patient ID: David Cisneros, male   DOB: [birth year], 86 y.o.   MRN: [id number]  HISTORY: He still remains quite weak overall.  He states that he has little pain at the chest tube site.  He does not complain of shortness of breath.   Vitals:   10/28/19 2017 11/04/2019 0349  BP: 131/71 133/76  Pulse: 86 89  Resp: 18 18  Temp: 98.1 F (36.7 C) 98.3 F (36.8 C)  SpO2: 96% 95%     EXAM:    Resp: Lungs are clear bilaterally.  No respiratory distress, normal effort. Heart:  Regular without murmurs Abd:  Abdomen is soft, non distended and non tender. No masses are palpable.  There is no rebound and no guarding.  Neurological: Alert and oriented to person, place, and time. Coordination normal.  Skin: Skin is warm and dry. No rash noted. No diaphoretic. No erythema. No pallor.  Psychiatric: Normal mood and affect although today he seems more depressed. Normal behavior. Judgment and thought content normal.   There is a small air leak with cough  ASSESSMENT: Persistent pneumothorax and air leak   PLAN:   We will obtain a CT scan of the chest today.  Perhaps this will give [place] some indication of the etiology of his pleural effusion and pneumothorax.  He may need a second drain placed.    Nestor Lewandowsky, MD

## 2019-10-29 NOTE — Plan of Care (Signed)
Patient has been having more pain today than he has.  He also started having some pain in his upper right leg, no obvious signs of trauma, MD notified.  He has not wanted to eat much today, states "just doesn't feel up to it".  Patient did sit in the chair for about a hour this morning and then got on the [facility] and passed a lot of flatus, still no BM yet.  Plan for enema this afternoon.  No significant changes.

## 2019-10-29 NOTE — Progress Notes (Signed)
Inpatient Diabetes Program Recommendations  AACE/ADA: New Consensus Statement on Inpatient Glycemic Control (2015)  Target Ranges:  Prepandial:   less than 140 mg/dL      Peak postprandial:   less than 180 mg/dL (1-2 hours)      Critically ill patients:  140 - 180 mg/dL   Lab Results  Component Value Date   GLUCAP 324 (H) 11/20/2019   HGBA1C 6.9 (H) 10/21/2019    Review of Glycemic Control Results for David Cisneros, David Cisneros (MRN [id number]) as of 11/22/2019 12:52  Ref. Range 10/28/2019 21:35 11/18/2019 05:54 11/01/2019 08:15 10/28/2019 11:43  Glucose-Capillary Latest Ref Range: 70 - 99 mg/dL 216 (H)  166 (H) 324 (H)    Diabetes history: DM2 Outpatient Diabetes medications: Metformin 500 BID; Amaryl 4 mg BID; Prednisone 5 mg QD: Current orders for Inpatient glycemic control: Novolog 0-9 units TID; 0-5 QHS; Novolog 4 units meal coverage; Prednisone 20 mg QD  Inpatient Diabetes Program Recommendations:     -Please consider Novolog 6 units TID with meals if eats at least 50% of meal  Thank you, Geoffry Paradise, RN, BSN Diabetes Coordinator Inpatient Diabetes Program [phone] (team pager from 8a-5p)

## 2019-10-29 NOTE — Care Management Important Message (Signed)
Important Message  Patient Details  Name: David Cisneros MRN: [id number] Date of Birth: 01/12/1931   Medicare Important Message Given:  Yes     Dannette Barbara 10/23/2019, 12:31 PM

## 2019-10-29 NOTE — Progress Notes (Signed)
PROGRESS NOTE    David Cisneros  [id number] DOB: 1931-06-11 DOA: 10/21/2019 PCP: Valera Castle, MD   Brief Narrative:  [name] a 86 y.o.malewith medical history significant offormer smoker, hypertension, diabetes mellitus, rheumatoid arthritis, chronic back pain, CKD stage IIIa, who presents with shortness of breath. Found to have large left-sided pleural effusion.  Chest tube was placed by cardiothoracic surgery. Thought to be due to rheumatoid arthritis. Also developed ileus-initially managed with NG tube which was pulled out by patient on 10/24/2019.  Subjective: Patient was complaining of left-sided chest and abdominal pain.  According to him pain medications do work.  Is also complaining of generalized weakness.  No bowel movements yet despite aggressive bowel regimen.  Assessment & Plan:   Principal Problem:   Pleural effusion on left Active Problems:   Hyperkalemia   Hypertension   Chronic back pain   CKD (chronic kidney disease), stage IIIa   Leukocytosis   Pneumothorax on left   Rheumatoid arthritis (HCC)   Malnutrition of moderate degree  Left pleural effusion:Etiology unclear, likely secondary to complication from RA as per intensvist. S/p left chest tube placement. Per EDP,approximately 3 L of bloody fluid were removed. Pleural fluid analysis is lymphocyte predominant, cytology is neg for malignancy, Rheumatoid factors pending. Pleural fluid cx NGTD.  CT chest was repeated today which shows improvement in pleural effusion and pneumothorax but there was some nodules and pleural irregularities concerning for malignancy. -Morphine prn for pain.  -Thoracic surgery following-appreciate their recommendations.    Pneumothorax on left: small. S/p left chest tube & draining bloody fluid. W/ air leak present  as per surg.  Managed as per surgery.   Hyperkalemia: resolved.  Will continue to monitor   Ileus: etiology unclear. Pt pulled out NG tube  10/24/19 and the NG tube was left out. Evidently pt is passing gas but still no bowel movement yet. Continue on colace, dulcolax, milk of Mg & will give polyethylene glycol x1 today. No bowel movements yet. Possible bloody bilious vomiting on evening on 10/22/19.  H&H are stable. GI signed off. Patient is eating okay, no nausea or vomiting.  Abdomen was soft. -Increase the dose of milk of magnesia. -Soapsuds enema. -Continue with other bowel regimen..  Rheumatoid arthritis: at home onXeljanz and prednisone. He was initially treated with IV Solu-Medrol. -Start him on increased dose of prednisone 20 mg daily.  AKI on CKDIIIa. Improved to 1.1 today. -Continue to monitor.  Hypertension.  Not on any antihypertensives. Currently normotensive. -Monitor with as needed hydralazine.  Chronic back pain. -Continue morphine as needed.  Leukocytosis.  Most likely secondary to steroid use. Patient remained afebrile. -Continue monitoring.  Generalized weakness.  PT recommending SNF. -Waiting for bed placement.  Objective: Vitals:   10/28/19 1136 10/28/19 1509 10/28/19 2017 11/16/2019 0349  BP: 128/66  131/71 133/76  Pulse: (!) 107  86 89  Resp: 18  18 18   Temp: 98 F (36.7 C)  98.1 F (36.7 C) 98.3 F (36.8 C)  TempSrc: Oral  Oral Oral  SpO2: 96%  96% 95%  Weight:  67.1 kg    Height:        Intake/Output Summary (Last 24 hours) at 10/28/2019 1717 Last data filed at 11/13/2019 1537 Gross per 24 hour  Intake 240 ml  Output 1215 ml  Net -975 ml   Filed Weights   10/21/19 1050 10/22/19 2241 10/28/19 1509  Weight: 72.6 kg 67.5 kg 67.1 kg    Examination:  General exam:  Appears calm and comfortable  Respiratory system: Clear to auscultation.  Left-sided chest tube in place respiratory effort normal. Cardiovascular system: S1 & S2 heard, RRR. No JVD, murmurs, rubs, gallops or clicks. Gastrointestinal system: Soft, nontender, nondistended, bowel sounds positive. Central nervous system:  Alert and oriented. No focal neurological deficits.Symmetric 5 x 5 power. Extremities: No edema, no cyanosis, pulses intact and symmetrical. Skin: No rashes, lesions or ulcers Psychiatry: Judgement and insight appear normal.     DVT prophylaxis: SCDs Code Status: DNR Family Communication: No family at bedside. Disposition Plan: Pending improvement and SNF bed availability.  Consultants:   Cardiothoracic surgery  GI  Procedures:   S/p chest tube placement on 10/21/19; s/p NG tube placement on 10/22/19 & pt pulled out NG tube 10/24/19   Antimicrobials:   Data Reviewed: I have personally reviewed following labs and imaging studies  CBC: Recent Labs  Lab 10/22/19 1946 10/25/19 0542 10/26/19 0527 10/27/19 0551 10/28/19 0621 10/28/2019 0554  WBC 15.5* 11.7* 14.4* 12.6* 15.4* 15.5*  NEUTROABS 14.5*  --   --   --   --   --   HGB 10.3* 10.0* 10.3* 10.3* 10.2* 10.7*  HCT 32.6* 32.0* 30.5* 32.2* 31.9* 33.5*  MCV 92.9 92.8 87.6 91.5 92.2 92.5  PLT 571* 525* 518* 474* 424* [id number]   Basic Metabolic Panel: Recent Labs  Lab 10/24/19 0559 10/25/19 0542 10/26/19 0527 10/27/19 0551 10/28/19 0621 11/15/2019 0554  NA 137 136 136 137 137 138  K 5.0 4.5 4.5 3.8 4.5 4.4  CL 92* 92* 93* 96* 98 99  CO2 29 31 31 29 29 27   GLUCOSE 175* 184* 149* 161* 158* 156*  BUN 52* 49* 38* 44* 35* 39*  CREATININE 1.49* 1.17 1.21 1.13 1.10 1.07  CALCIUM 8.4* 8.5* 8.8* 8.4* 8.4* 8.4*  MG 2.8* 2.6* 2.2 2.2 2.0  --   PHOS 5.2* 3.6 3.4 3.3 3.1  --    GFR: Estimated Creatinine Clearance: 43.1 mL/min (by C-G formula based on SCr of 1.07 mg/dL). Liver Function Tests: Recent Labs  Lab 10/22/19 1946 10/24/19 0559  AST 16 14*  ALT 27 20  ALKPHOS 59 53  BILITOT 0.7 0.9  PROT 6.2* 6.1*  ALBUMIN 2.7* 2.7*   Recent Labs  Lab 10/22/19 1946  LIPASE 77*   No results for input(s): AMMONIA in the last 168 hours. Coagulation Profile: Recent Labs  Lab 10/22/19 1946  INR 1.2   Cardiac Enzymes: No  results for input(s): CKTOTAL, CKMB, CKMBINDEX, TROPONINI in the last 168 hours. BNP (last 3 results) No results for input(s): PROBNP in the last 8760 hours. HbA1C: No results for input(s): HGBA1C in the last 72 hours. CBG: Recent Labs  Lab 10/28/19 1136 10/28/19 1650 10/28/19 2135 11/22/2019 0815 11/11/2019 1143  GLUCAP 214* 267* 216* 166* 324*   Lipid Profile: No results for input(s): CHOL, HDL, LDLCALC, TRIG, CHOLHDL, LDLDIRECT in the last 72 hours. Thyroid Function Tests: No results for input(s): TSH, T4TOTAL, FREET4, T3FREE, THYROIDAB in the last 72 hours. Anemia Panel: No results for input(s): VITAMINB12, FOLATE, FERRITIN, TIBC, IRON, RETICCTPCT in the last 72 hours. Sepsis Labs: No results for input(s): PROCALCITON, LATICACIDVEN in the last 168 hours.  Recent Results (from the past 240 hour(s))  Respiratory Panel by RT PCR (Flu A&B, Covid) - Nasopharyngeal Swab     Status: None   Collection Time: 10/21/19 12:33 PM   Specimen: Nasopharyngeal Swab  Result Value Ref Range Status   SARS Coronavirus 2 by RT PCR NEGATIVE NEGATIVE  Final    Comment: (NOTE) SARS-CoV-2 target nucleic acids are NOT DETECTED. The SARS-CoV-2 RNA is generally detectable in upper respiratoy specimens during the acute phase of infection. The lowest concentration of SARS-CoV-2 viral copies this assay can detect is 131 copies/mL. A negative result does not preclude SARS-Cov-2 infection and should not be used as the sole basis for treatment or other patient management decisions. A negative result may occur with  improper specimen collection/handling, submission of specimen other than nasopharyngeal swab, presence of viral mutation(s) within the areas targeted by this assay, and inadequate number of viral copies (<131 copies/mL). A negative result must be combined with clinical observations, patient history, and epidemiological information. The expected result is Negative. Fact Sheet for Patients:    PinkCheek.be Fact Sheet for Healthcare Providers:  GravelBags.it This test is not yet ap proved or cleared by the [place] FDA and  has been authorized for detection and/or diagnosis of SARS-CoV-2 by FDA under an Emergency Use Authorization (EUA). This EUA will remain  in effect (meaning this test can be used) for the duration of the COVID-19 declaration under Section 564(b)(1) of the Act, 21 U.S.C. section 360bbb-3(b)(1), unless the authorization is terminated or revoked sooner.    Influenza A by PCR NEGATIVE NEGATIVE Final   Influenza B by PCR NEGATIVE NEGATIVE Final    Comment: (NOTE) The Xpert Xpress SARS-CoV-2/FLU/RSV assay is intended as an aid in  the diagnosis of influenza from Nasopharyngeal swab specimens and  should not be used as a sole basis for treatment. Nasal washings and  aspirates are unacceptable for Xpert Xpress SARS-CoV-2/FLU/RSV  testing. Fact Sheet for Patients: PinkCheek.be Fact Sheet for Healthcare Providers: GravelBags.it This test is not yet approved or cleared by the [place] FDA and  has been authorized for detection and/or diagnosis of SARS-CoV-2 by  FDA under an Emergency Use Authorization (EUA). This EUA will remain  in effect (meaning this test can be used) for the duration of the  Covid-19 declaration under Section 564(b)(1) of the Act, 21  U.S.C. section 360bbb-3(b)(1), unless the authorization is  terminated or revoked. Performed at [facility], [address] [place], [place] [address]   Body fluid culture (includes gram stain)     Status: None   Collection Time: 10/21/19  1:53 PM   Specimen: Pleural Fluid  Result Value Ref Range Status   Specimen Description   Final    PLEURAL Performed at [facility], [address] [place], [place] [address]    Special Requests   Final    NONE Performed  at [facility], [address] [place], [place] [address]    Gram Stain   Final    RARE WBC PRESENT, PREDOMINANTLY PMN NO ORGANISMS SEEN    Culture   Final    NO GROWTH 3 DAYS Performed at [facility], [place] [address] [place],  [address]    Report Status 10/26/2019 FINAL  Final  CULTURE, BLOOD (ROUTINE X 2) w Reflex to ID Panel     Status: None   Collection Time: 10/21/19 10:04 PM   Specimen: BLOOD  Result Value Ref Range Status   Specimen Description BLOOD RIGHT ANTECUBITAL  Final   Special Requests   Final    BOTTLES DRAWN AEROBIC AND ANAEROBIC Blood Culture results may not be optimal due to an excessive volume of blood received in culture bottles   Culture   Final    NO GROWTH 5 DAYS Performed at  [facility], [address] [place], [place] [address]    Report Status 10/26/2019 FINAL  Final  CULTURE, BLOOD (ROUTINE X 2) w Reflex to ID Panel     Status: None   Collection Time: 10/21/19 10:04 PM   Specimen: BLOOD  Result Value Ref Range Status   Specimen Description BLOOD RIGHT ANTECUBITAL  Final   Special Requests   Final    BOTTLES DRAWN AEROBIC AND ANAEROBIC Blood Culture results may not be optimal due to an excessive volume of blood received in culture bottles   Culture   Final    NO GROWTH 5 DAYS Performed at [facility], [address] [place], [place] [address]    Report Status 10/26/2019 FINAL  Final  MRSA PCR Screening     Status: None   Collection Time: 10/22/19 10:53 PM   Specimen: Nasal Mucosa; Nasopharyngeal  Result Value Ref Range Status   MRSA by PCR NEGATIVE NEGATIVE Final    Comment:        The GeneXpert MRSA Assay (FDA approved for NASAL specimens only), is one component of a comprehensive MRSA colonization surveillance program. It is not intended to diagnose MRSA infection nor to guide or monitor treatment for MRSA infections. Performed at [facility], [address] [place],  [place] [address]      Radiology Studies: DG Chest 2 View  Result Date: 10/28/2019 CLINICAL DATA:  Left chest tube EXAM: CHEST - 2 VIEW COMPARISON:  Two days ago FINDINGS: Left basal chest tube in stable position. Small left pneumothorax measuring 10-20%, seen at the apex, medially, and at the base. The right lung is clear. Stable heart size. IMPRESSION: 10-20% left pneumothorax seen at the apex and base. No definite change from 2 days ago. Electronically Signed   By: Monte Fantasia M.D.   On: 10/28/2019 08:19   CT Chest Wo Contrast  Result Date: 11/06/2019 CLINICAL DATA:  Evaluate for persistent pneumothorax. EXAM: CT CHEST WITHOUT CONTRAST TECHNIQUE: Multidetector CT imaging of the chest was performed following the standard protocol without IV contrast. COMPARISON:  10/27/2018 FINDINGS: Cardiovascular: The heart size appears mildly enlarged. Aortic atherosclerosis. Left main, lad, left circumflex and RCA coronary artery atherosclerotic calcifications. No pericardial effusion identified. Mediastinum/Nodes: Normal appearance of the thyroid gland. The trachea appears patent and is midline. Normal appearance of the esophagus. No supraclavicular, axillary, mediastinal, or hilar adenopathy. Lungs/Pleura: Left basilar chest tube is in place entering from a left lateral chest wall approach. Diffuse irregular and nodular pleural thickening is identified. A small volume of pleural fluid overlying the posterior left lung, image 55/2. Moderate left loculated pneumothorax is identified with multiple points of suspect adhesion to the chest wall. Within the posterior left upper lobe there is a lung nodule which measures 1.8 x 2.3 cm, image 47/4. Pleural base soft tissue nodule overlying the anterior left upper lobe measures 2.7 x 1.6 cm, image 38/2. Calcified granuloma identified within the posterior right upper lobe. Upper Abdomen: No acute abnormality identified. Gallstones. Aortic atherosclerosis. Musculoskeletal: No chest  wall mass or suspicious bone lesions identified. IMPRESSION: 1. Left basilar chest tube is in place entering from a left lateral chest wall approach. There is a moderate loculated left pneumothorax with multiple points of suspected adhesion to the chest wall. There is been considerable decrease in previously noted left pleural fluid. 2. Posterior and anterior left upper lobe soft tissue nodules are identified, suspicious for malignancy. Additionally, there is mild diffuse irregular thickening and nodularity of  the left lung pleura. Findings are suspicious for malignancy. 3. Aortic atherosclerosis. Multi vessel coronary artery calcifications noted. 4. Gallstones. Aortic Atherosclerosis (ICD10-I70.0). Electronically Signed   By: Kerby Moors M.D.   On: 11/01/2019 13:13    Scheduled Meds: . bisacodyl  10 mg Rectal Once  . dextromethorphan-guaiFENesin  1 tablet Oral BID  . docusate sodium  200 mg Oral BID  . finasteride  5 mg Oral Daily  . insulin aspart  0-5 Units Subcutaneous QHS  . insulin aspart  0-9 Units Subcutaneous TID WC  . [START ON 10/30/2019] insulin aspart  6 Units Subcutaneous TID WC  . linaclotide  145 mcg Oral q morning - 10a  . magnesium hydroxide  30 mL Oral Daily  . magnesium hydroxide  30 mL Oral Once  . multivitamin with minerals  1 tablet Oral Daily  . pantoprazole (PROTONIX) IV  40 mg Intravenous Q24H  . predniSONE  20 mg Oral Daily  . Ensure Max Protein  11 oz Oral BID BM  . scopolamine  1 patch Transdermal Q72H  . sodium chloride flush  3 mL Intravenous Once  . Tofacitinib Citrate  5 mg Oral BID   Continuous Infusions:   LOS: 7 days   Time spent: 35 min  Lorella Nimrod, MD [facility] Pager [phone]  If 7PM-7AM, please contact night-coverage [email or web address] Password [facility] 11/07/2019, 5:17 PM   This record has been created using Dragon voice recognition software. Errors have been sought and corrected,but may not always be located. Such creation errors do  not reflect on the standard of care.

## 2019-10-29 NOTE — Progress Notes (Signed)
OT Cancellation Note  Patient Details Name: David Cisneros MRN: [id number] DOB: November 12, 1930   Cancelled Treatment:    Reason Eval/Treat Not Completed: Patient at procedure or test/ unavailable  RN in room prepping to administer enema to pt when OT presents for treatment. Will f/u as able for OT treatment. Thank you.   Gerrianne Scale, [place], OTR/L ascom [phone] 10/26/2019, 3:31 PM

## 2019-10-30 ENCOUNTER — Inpatient Hospital Stay: Payer: Medicare Other

## 2019-10-30 LAB — CBC
HCT: 33 % — ABNORMAL LOW (ref 39.0–52.0)
Hemoglobin: 10.5 g/dL — ABNORMAL LOW (ref 13.0–17.0)
MCH: 29.6 pg (ref 26.0–34.0)
MCHC: 31.8 g/dL (ref 30.0–36.0)
MCV: 93 fL (ref 80.0–100.0)
Platelets: 379 10*3/uL (ref 150–400)
RBC: 3.55 MIL/uL — ABNORMAL LOW (ref 4.22–5.81)
RDW: 13.4 % (ref 11.5–15.5)
WBC: 12.9 10*3/uL — ABNORMAL HIGH (ref 4.0–10.5)
nRBC: 0.4 % — ABNORMAL HIGH (ref 0.0–0.2)

## 2019-10-30 LAB — GLUCOSE, CAPILLARY
Glucose-Capillary: 155 mg/dL — ABNORMAL HIGH (ref 70–99)
Glucose-Capillary: 177 mg/dL — ABNORMAL HIGH (ref 70–99)
Glucose-Capillary: 170 mg/dL — ABNORMAL HIGH (ref 70–99)
Glucose-Capillary: 240 mg/dL — ABNORMAL HIGH (ref 70–99)

## 2019-10-30 LAB — PROTIME-INR
INR: 1.2 (ref 0.8–1.2)
Prothrombin Time: 15 seconds (ref 11.4–15.2)

## 2019-10-30 LAB — APTT: aPTT: 30 seconds (ref 24–36)

## 2019-10-30 LAB — FIBRINOGEN: Fibrinogen: 516 mg/dL — ABNORMAL HIGH (ref 210–475)

## 2019-10-30 MED ORDER — FENTANYL CITRATE (PF) 100 MCG/2ML IJ SOLN
INTRAMUSCULAR | Status: AC | PRN
  Administered 2019-10-30 (×2): 25 ug via INTRAVENOUS

## 2019-10-30 MED ORDER — FENTANYL CITRATE (PF) 100 MCG/2ML IJ SOLN
INTRAMUSCULAR | Status: AC
  Filled 2019-10-30: qty 2

## 2019-10-30 MED ORDER — INSULIN ASPART 100 UNIT/ML ~~LOC~~ SOLN
4.0000 [IU] | Freq: Three times a day (TID) | SUBCUTANEOUS | Status: DC
  Administered 2019-10-31 – 2019-11-02 (×7): 4 [IU] via SUBCUTANEOUS
  Filled 2019-10-30 (×8): qty 1

## 2019-10-30 MED ORDER — MIDAZOLAM HCL 2 MG/2ML IJ SOLN
INTRAMUSCULAR | Status: AC
  Filled 2019-10-30: qty 2

## 2019-10-30 MED ORDER — MIDAZOLAM HCL 2 MG/2ML IJ SOLN
INTRAMUSCULAR | Status: AC | PRN
  Administered 2019-10-30 (×2): 0.5 mg via INTRAVENOUS

## 2019-10-30 NOTE — Progress Notes (Signed)
Patient did not want to take the Soap suds enema today since he had had a lot going on.  He said he would rather take it tomorrow.  I talked to the MD about this and she gave verbal order to change it.

## 2019-10-30 NOTE — Progress Notes (Signed)
   10/30/19 1700  Clinical Encounter Type  Visited With Patient  Visit Type Initial;Psychological support;Spiritual support  Referral From [facility] Prayer;Emotional  Chaplain visited with patient who has been in the hospital with pulmonary issues. Patient was lying in the bed upon arrival. Patient did not seem to be in good spirits. It should also be noted that patient's wife is in the hospital due to a broken hip. Patient stated he had a lot going on health wise and family wise. Patient said that all he had left was to lean on God. Chaplain had a small prayer with patient.

## 2019-10-30 NOTE — Progress Notes (Signed)
PT Cancellation Note  Patient Details Name: [name] MRN: [id number] DOB: 04/23/1931   Cancelled Treatment:    Reason Eval/Treat Not Completed: Patient at procedure or test/unavailable. Upon arrival for PT treatment, pt off the unit and not available. PT will follow up at later date/time as able and pt appropriate.   Zachary George PT, DPT 3:03 PM,10/30/19 [phone]   Jshon Ibe Drucilla Chalet 10/30/2019, 3:03 PM

## 2019-10-30 NOTE — Progress Notes (Signed)
PROGRESS NOTE    David Cisneros  [id number] DOB: 1930-12-10 DOA: 10/21/2019 PCP: Valera Castle, MD   Brief Narrative:  [name] a 86 y.o.malewith medical history significant offormer smoker, hypertension, diabetes mellitus, rheumatoid arthritis, chronic back pain, CKD stage IIIa, who presents with shortness of breath. Found to have large left-sided pleural effusion.  Chest tube was placed by cardiothoracic surgery. Thought to be due to rheumatoid arthritis.  Repeat CT chest concerning for underlying malignancy. Also developed ileus-initially managed with NG tube which was pulled out by patient on 10/24/2019.  Subjective: Patient was complaining of left-sided chest.  He was very distressed because of his wife in ER after having a hip fracture secondary to fall. He continued to feel very weak.  Assessment & Plan:   Principal Problem:   Pleural effusion on left Active Problems:   Hyperkalemia   Hypertension   Chronic back pain   CKD (chronic kidney disease), stage IIIa   Leukocytosis   Pneumothorax on left   Rheumatoid arthritis (HCC)   Malnutrition of moderate degree  Left pleural effusion:Etiology unclear, likely secondary to underlying malignancy, he needs a biopsy, most likely be done during an anterior chest tube placement by IR.  S/p left chest tube placement. Per EDP,approximately 3 L of bloody fluid were removed. Pleural fluid analysis is lymphocyte predominant, cytology is neg for malignancy, Rheumatoid factors negative. Pleural fluid cx NGTD.  CT chest was repeated today which shows improvement in pleural effusion and pneumothorax but there was some nodules and pleural irregularities concerning for malignancy. -Morphine prn for pain.  -Thoracic surgery following-appreciate their recommendations.  Plan is to put an anterior chest tube and eventually remove this large bore posterior chest tube because it is causing quite a bit of  discomfort.  Pneumothorax on left: small. S/p left chest tube & draining bloody fluid. W/ air leak present  as per surg.  Managed as per surgery.   Hyperkalemia: resolved.  Will continue to monitor   Ileus: etiology unclear. Pt pulled out NG tube 10/24/19 and the NG tube was left out. Evidently pt is passing gas but still no bowel movement yet. Continue on colace, dulcolax, milk of Mg & will give polyethylene glycol x1 today. No bowel movements yet. Possible bloody bilious vomiting on evening on 10/22/19.  H&H are stable. GI signed off. Patient had a small BM after soapsuds enema yesterday. Patient is eating okay, no nausea or vomiting.  Abdomen was soft. -Increase the dose of milk of magnesia. -Soapsuds enema. -Continue with other bowel regimen..  Rheumatoid arthritis: at home onXeljanz and prednisone. He was initially treated with IV Solu-Medrol. -Start him on increased dose of prednisone 20 mg daily.  AKI on CKDIIIa. Improved to 1.1 today. -Continue to monitor.  Hypertension.  Not on any antihypertensives. Currently normotensive. -Monitor with as needed hydralazine.  Chronic back pain. -Continue morphine as needed.  Leukocytosis.  Most likely secondary to steroid use. Patient remained afebrile. -Continue monitoring.  Generalized weakness.  PT recommending SNF. -Waiting for bed placement.  Objective: Vitals:   11/20/2019 1700 11/22/2019 2047 10/30/19 0517 10/30/19 1154  BP: 130/60 134/67 (!) 151/72 135/66  Pulse: (!) 101 93 94 (!) 105  Resp: 18 18 20 16   Temp: 97.7 F (36.5 C) 97.8 F (36.6 C) 98.1 F (36.7 C) 97.8 F (36.6 C)  TempSrc: Oral Oral Oral Oral  SpO2:  96% 95% 95%  Weight:      Height:  Intake/Output Summary (Last 24 hours) at 10/30/2019 1414 Last data filed at 10/30/2019 1027 Gross per 24 hour  Intake 240 ml  Output 465 ml  Net -225 ml   Filed Weights   10/21/19 1050 10/22/19 2241 10/28/19 1509  Weight: 72.6 kg 67.5 kg 67.1 kg     Examination:  General exam: Appears calm and comfortable  Respiratory system: Clear to auscultation.  Left-sided chest tube in place respiratory effort normal. Cardiovascular system: S1 & S2 heard, RRR. No JVD, murmurs, rubs, gallops or clicks. Gastrointestinal system: Soft, nontender, nondistended, bowel sounds positive. Central nervous system: Alert and oriented. No focal neurological deficits.Symmetric 5 x 5 power. Extremities: No edema, no cyanosis, pulses intact and symmetrical. Skin: No rashes, lesions or ulcers Psychiatry: Judgement and insight appear normal.    DVT prophylaxis: SCDs Code Status: DNR Family Communication: No family at bedside. Disposition Plan: Pending improvement and SNF bed availability.  Consultants:   Cardiothoracic surgery  GI  IR  Procedures:   S/p chest tube placement on 10/21/19; s/p NG tube placement on 10/22/19 & pt pulled out NG tube 10/24/19   Antimicrobials:   Data Reviewed: I have personally reviewed following labs and imaging studies  CBC: Recent Labs  Lab 10/26/19 0527 10/27/19 0551 10/28/19 0621 11/10/2019 0554 10/30/19 0441  WBC 14.4* 12.6* 15.4* 15.5* 12.9*  HGB 10.3* 10.3* 10.2* 10.7* 10.5*  HCT 30.5* 32.2* 31.9* 33.5* 33.0*  MCV 87.6 91.5 92.2 92.5 93.0  PLT 518* 474* 424* 380 [id number]   Basic Metabolic Panel: Recent Labs  Lab 10/24/19 0559 10/25/19 0542 10/26/19 0527 10/27/19 0551 10/28/19 0621 10/28/2019 0554  NA 137 136 136 137 137 138  K 5.0 4.5 4.5 3.8 4.5 4.4  CL 92* 92* 93* 96* 98 99  CO2 29 31 31 29 29 27   GLUCOSE 175* 184* 149* 161* 158* 156*  BUN 52* 49* 38* 44* 35* 39*  CREATININE 1.49* 1.17 1.21 1.13 1.10 1.07  CALCIUM 8.4* 8.5* 8.8* 8.4* 8.4* 8.4*  MG 2.8* 2.6* 2.2 2.2 2.0  --   PHOS 5.2* 3.6 3.4 3.3 3.1  --    GFR: Estimated Creatinine Clearance: 43.1 mL/min (by C-G formula based on SCr of 1.07 mg/dL). Liver Function Tests: Recent Labs  Lab 10/24/19 0559  AST 14*  ALT 20  ALKPHOS 53   BILITOT 0.9  PROT 6.1*  ALBUMIN 2.7*   No results for input(s): LIPASE, AMYLASE in the last 168 hours. No results for input(s): AMMONIA in the last 168 hours. Coagulation Profile: Recent Labs  Lab 10/30/19 0441  INR 1.2   Cardiac Enzymes: No results for input(s): CKTOTAL, CKMB, CKMBINDEX, TROPONINI in the last 168 hours. BNP (last 3 results) No results for input(s): PROBNP in the last 8760 hours. HbA1C: No results for input(s): HGBA1C in the last 72 hours. CBG: Recent Labs  Lab 11/02/2019 1143 10/25/2019 1727 10/23/2019 2047 10/30/19 0731 10/30/19 1152  GLUCAP 324* 187* 307* 155* 177*   Lipid Profile: No results for input(s): CHOL, HDL, LDLCALC, TRIG, CHOLHDL, LDLDIRECT in the last 72 hours. Thyroid Function Tests: No results for input(s): TSH, T4TOTAL, FREET4, T3FREE, THYROIDAB in the last 72 hours. Anemia Panel: No results for input(s): VITAMINB12, FOLATE, FERRITIN, TIBC, IRON, RETICCTPCT in the last 72 hours. Sepsis Labs: No results for input(s): PROCALCITON, LATICACIDVEN in the last 168 hours.  Recent Results (from the past 240 hour(s))  Respiratory Panel by RT PCR (Flu A&B, Covid) - Nasopharyngeal Swab     Status: None   Collection  Time: 10/21/19 12:33 PM   Specimen: Nasopharyngeal Swab  Result Value Ref Range Status   SARS Coronavirus 2 by RT PCR NEGATIVE NEGATIVE Final    Comment: (NOTE) SARS-CoV-2 target nucleic acids are NOT DETECTED. The SARS-CoV-2 RNA is generally detectable in upper respiratoy specimens during the acute phase of infection. The lowest concentration of SARS-CoV-2 viral copies this assay can detect is 131 copies/mL. A negative result does not preclude SARS-Cov-2 infection and should not be used as the sole basis for treatment or other patient management decisions. A negative result may occur with  improper specimen collection/handling, submission of specimen other than nasopharyngeal swab, presence of viral mutation(s) within the areas  targeted by this assay, and inadequate number of viral copies (<131 copies/mL). A negative result must be combined with clinical observations, patient history, and epidemiological information. The expected result is Negative. Fact Sheet for Patients:  PinkCheek.be Fact Sheet for Healthcare Providers:  GravelBags.it This test is not yet ap proved or cleared by the [place] FDA and  has been authorized for detection and/or diagnosis of SARS-CoV-2 by FDA under an Emergency Use Authorization (EUA). This EUA will remain  in effect (meaning this test can be used) for the duration of the COVID-19 declaration under Section 564(b)(1) of the Act, 21 U.S.C. section 360bbb-3(b)(1), unless the authorization is terminated or revoked sooner.    Influenza A by PCR NEGATIVE NEGATIVE Final   Influenza B by PCR NEGATIVE NEGATIVE Final    Comment: (NOTE) The Xpert Xpress SARS-CoV-2/FLU/RSV assay is intended as an aid in  the diagnosis of influenza from Nasopharyngeal swab specimens and  should not be used as a sole basis for treatment. Nasal washings and  aspirates are unacceptable for Xpert Xpress SARS-CoV-2/FLU/RSV  testing. Fact Sheet for Patients: PinkCheek.be Fact Sheet for Healthcare Providers: GravelBags.it This test is not yet approved or cleared by the [place] FDA and  has been authorized for detection and/or diagnosis of SARS-CoV-2 by  FDA under an Emergency Use Authorization (EUA). This EUA will remain  in effect (meaning this test can be used) for the duration of the  Covid-19 declaration under Section 564(b)(1) of the Act, 21  U.S.C. section 360bbb-3(b)(1), unless the authorization is  terminated or revoked. Performed at [facility], [address] [place], [place] [address]   Body fluid culture (includes gram stain)     Status: None   Collection  Time: 10/21/19  1:53 PM   Specimen: Pleural Fluid  Result Value Ref Range Status   Specimen Description   Final    PLEURAL Performed at [facility], [address], [place], [place] [address]    Special Requests   Final    NONE Performed at [facility], [address] [place], [place] [address]    Gram Stain   Final    RARE WBC PRESENT, PREDOMINANTLY PMN NO ORGANISMS SEEN    Culture   Final    NO GROWTH 3 DAYS Performed at [facility], [address], [place], [place] [address]    Report Status 10/26/2019 FINAL  Final  CULTURE, BLOOD (ROUTINE X 2) w Reflex to ID Panel     Status: None   Collection Time: 10/21/19 10:04 PM   Specimen: BLOOD  Result Value Ref Range Status   Specimen Description BLOOD RIGHT ANTECUBITAL  Final   Special Requests   Final    BOTTLES DRAWN AEROBIC AND ANAEROBIC Blood Culture results may not be optimal due to  an excessive volume of blood received in culture bottles   Culture   Final    NO GROWTH 5 DAYS Performed at [facility], [address] [place], [place] [address]    Report Status 10/26/2019 FINAL  Final  CULTURE, BLOOD (ROUTINE X 2) w Reflex to ID Panel     Status: None   Collection Time: 10/21/19 10:04 PM   Specimen: BLOOD  Result Value Ref Range Status   Specimen Description BLOOD RIGHT ANTECUBITAL  Final   Special Requests   Final    BOTTLES DRAWN AEROBIC AND ANAEROBIC Blood Culture results may not be optimal due to an excessive volume of blood received in culture bottles   Culture   Final    NO GROWTH 5 DAYS Performed at [facility] [address] [place], [place] [address]    Report Status 10/26/2019 FINAL  Final  MRSA PCR Screening     Status: None   Collection Time: 10/22/19 10:53 PM   Specimen: Nasal Mucosa; Nasopharyngeal  Result Value Ref Range Status   MRSA by PCR NEGATIVE NEGATIVE Final    Comment:        The GeneXpert MRSA Assay (FDA approved for NASAL  specimens only), is one component of a comprehensive MRSA colonization surveillance program. It is not intended to diagnose MRSA infection nor to guide or monitor treatment for MRSA infections. Performed at [facility], [address], [place], [place] [address]      Radiology Studies: CT Chest Wo Contrast  Result Date: 11/19/2019 CLINICAL DATA:  Evaluate for persistent pneumothorax. EXAM: CT CHEST WITHOUT CONTRAST TECHNIQUE: Multidetector CT imaging of the chest was performed following the standard protocol without IV contrast. COMPARISON:  10/27/2018 FINDINGS: Cardiovascular: The heart size appears mildly enlarged. Aortic atherosclerosis. Left main, lad, left circumflex and RCA coronary artery atherosclerotic calcifications. No pericardial effusion identified. Mediastinum/Nodes: Normal appearance of the thyroid gland. The trachea appears patent and is midline. Normal appearance of the esophagus. No supraclavicular, axillary, mediastinal, or hilar adenopathy. Lungs/Pleura: Left basilar chest tube is in place entering from a left lateral chest wall approach. Diffuse irregular and nodular pleural thickening is identified. A small volume of pleural fluid overlying the posterior left lung, image 55/2. Moderate left loculated pneumothorax is identified with multiple points of suspect adhesion to the chest wall. Within the posterior left upper lobe there is a lung nodule which measures 1.8 x 2.3 cm, image 47/4. Pleural base soft tissue nodule overlying the anterior left upper lobe measures 2.7 x 1.6 cm, image 38/2. Calcified granuloma identified within the posterior right upper lobe. Upper Abdomen: No acute abnormality identified. Gallstones. Aortic atherosclerosis. Musculoskeletal: No chest wall mass or suspicious bone lesions identified. IMPRESSION: 1. Left basilar chest tube is in place entering from a left lateral chest wall approach. There is a moderate loculated left pneumothorax with  multiple points of suspected adhesion to the chest wall. There is been considerable decrease in previously noted left pleural fluid. 2. Posterior and anterior left upper lobe soft tissue nodules are identified, suspicious for malignancy. Additionally, there is mild diffuse irregular thickening and nodularity of the left lung pleura. Findings are suspicious for malignancy. 3. Aortic atherosclerosis. Multi vessel coronary artery calcifications noted. 4. Gallstones. Aortic Atherosclerosis (ICD10-I70.0). Electronically Signed   By: Kerby Moors M.D.   On: 11/15/2019 13:13    Scheduled Meds: . bisacodyl  10 mg Rectal Once  . dextromethorphan-guaiFENesin  1 tablet Oral BID  . docusate sodium  200  mg Oral BID  . fentaNYL      . finasteride  5 mg Oral Daily  . insulin aspart  0-5 Units Subcutaneous QHS  . insulin aspart  0-9 Units Subcutaneous TID WC  . insulin aspart  6 Units Subcutaneous TID WC  . linaclotide  145 mcg Oral q morning - 10a  . magnesium hydroxide  30 mL Oral Daily  . midazolam      . multivitamin with minerals  1 tablet Oral Daily  . pantoprazole (PROTONIX) IV  40 mg Intravenous Q24H  . predniSONE  20 mg Oral Daily  . Ensure Max Protein  11 oz Oral BID BM  . scopolamine  1 patch Transdermal Q72H  . sodium chloride flush  3 mL Intravenous Once  . Tofacitinib Citrate  5 mg Oral BID   Continuous Infusions:   LOS: 8 days   Time spent: 35 min  Lorella Nimrod, MD [facility] Pager [phone]  If 7PM-7AM, please contact night-coverage [email or web address] Password [facility] 10/30/2019, 2:14 PM   This record has been created using Dragon voice recognition software. Errors have been sought and corrected,but may not always be located. Such creation errors do not reflect on the standard of care.

## 2019-10-30 NOTE — Progress Notes (Signed)
  Patient ID: David Cisneros, male   DOB: 18-Jul-1931, 86 y.o.   MRN: [id number]  HISTORY: He has no new complaints today.  He states that he did tolerate the CT scan he had made yesterday reasonably well.  He still too weak to get out of bed and ambulate.  He seems significantly depressed this morning as his wife is in the hospital with a fractured hip.  His son is currently with his mother and the emergency department and I was able to contact him by phone to review the results of the CT scan with the patient and the son.   Vitals:    2047 10/30/19 0517  BP: 134/67 (!) 151/72  Pulse: 93 94  Resp: 18 20  Temp: 97.8 F (36.6 C) 98.1 F (36.7 C)  SpO2: 96% 95%     EXAM:    Resp: Lungs are clear on the right but slightly diminished on the left.  No respiratory distress, normal effort. Heart:  Regular without murmurs Abd:  Abdomen is soft, non distended and non tender. No masses are palpable.  There is no rebound and no guarding.  Neurological: Alert and oriented to person, place, and time. Coordination normal.  Skin: Skin is warm and dry. No rash noted. No diaphoretic. No erythema. No pallor.  Psychiatric: Normal mood and affect. Normal behavior. Judgment and thought content normal.   I do not appreciate any air leak today although his cough was minimal  ASSESSMENT: I have independently reviewed the chest CT and I discussed the results with our radiologist.  They believe that there is a malignancy present in the left upper lobe with a malignant pleural effusion.  I expressed this to the patient's son and to the patient as well and is unclear if they are interested in additional biopsies and/or treatment.   PLAN:   I am still concerned that the patient has a malignant pleural effusion.  I have asked our interventional radiologist to replace the chest tube that he currently has in place with a smaller pigtail catheter anteriorly which will allow [place] to remove the large bore type  catheter.  This will make a subsequent quick care much easier.  Should the patient require biopsy he will have a chest tube in place at that time.  I did discuss the presumed diagnosis with the son and the patient and they had all their questions answered.  We will continue to follow him.    Nestor Lewandowsky, MD

## 2019-10-30 NOTE — Progress Notes (Signed)
Inpatient Diabetes Program Recommendations  AACE/ADA: New Consensus Statement on Inpatient Glycemic Control (2015)  Target Ranges:  Prepandial:   less than 140 mg/dL      Peak postprandial:   less than 180 mg/dL (1-2 hours)      Critically ill patients:  140 - 180 mg/dL   Lab Results  Component Value Date   GLUCAP 177 (H) 10/30/2019   HGBA1C 6.9 (H) 10/21/2019   Results for ANTAWON, MAYHUGH (MRN [id number]) as of 10/30/2019 14:39  Ref. Range 11/12/2019 11:43 11/22/2019 17:27 10/27/2019 20:47 10/30/2019 07:31 10/30/2019 11:52  Glucose-Capillary Latest Ref Range: 70 - 99 mg/dL 324 (H) 187 (H) 307 (H) 155 (H) 177 (H)   Diabetes history: DM2 Outpatient Diabetes medications: Metformin 500 BID; Amaryl 4 mg BID; Prednisone 5 mg QD: Current orders for Inpatient glycemic control: Novolog 0-9 units TID; 0-5 QHS; Novolog 6 units meal coverage; Prednisone 20 mg QD Inpatient Diabetes Program Recommendations:    Blood sugars improved today.  Consider reducing Novolog meal coverage back down to 3 units tid with meals.   Thanks  Adah Perl, RN, BC-ADM Inpatient Diabetes Coordinator Pager [phone] (8a-5p)

## 2019-10-30 NOTE — Progress Notes (Signed)
Patient clinically stable post CT placement per DR Pascal Lux. Vitals stable throughout. Upper left ct to suction per orders. Dressing dry and intact. Versed 1mg  along with Fentanyl 68mcg IV for procedure. Returned to 205 post Pensions consultant, with report given to [employer].

## 2019-10-30 NOTE — Progress Notes (Signed)
Patient's wife had hip surgery today and is being admitted to 1A.  MD wanted to see if it was possible to put both patient's in the same room. Dr. Reesa Chew gave the verbal order for the transfer.  I talked to the charge nurse on 1A and they were aware of the plans and said once his wife got settled in then they could accept him.

## 2019-10-30 NOTE — Progress Notes (Signed)
Occupational Therapy Treatment Patient Details Name: David Cisneros MRN: [id number] DOB: 1930/11/01 Today's Date: 10/30/2019    History of present illness Per MD notes: Pt is an 86 y.o. male with medical history significant of former smoker, hypertension, diabetes mellitus, rheumatoid arthritis, chronic back pain, CKD stage IIIa, who presents with shortness of breath.  Patient was found to have a large left-sided pleural effusion by CXR. Left side chest tube was placed in ED.  MD assessment includes: L pleural effusion, hyperkalemia, HTN, chronic back pain, CKD III, leukocytosis, and RA.   OT comments  Pt seen for OT tx this date to f/u re: safety with self care ADLs and ADL mobility. Pt demos some decreased mood-potentially distracted d/t situational stressors with his wife's health. Pt with decreased fxl activity tolerance. Does eventually become agreeable to transitioning to EOB and states he is agreeable to chair t/f. But twice during session-appears to forget plan for treatment and asks, exasperated, "what are we doing?" This seems somewhat different from last OT session with this therapist and is relayed to RN. Pt able to t/f with MIN/MOD A and performs fxl mobility with MIN A with RW to take 5-6 shuffling side steps from bed to chair. Pt somewhat participatory in seated grooming tasks, but again, somewhat distracted. Pt in chair with chair alarm at end of session and awaiting potential change in chest tubing this date. SNF remains most appropriate d/c disposition.    Follow Up Recommendations  SNF    Equipment Recommendations  Other (comment)(defer to next level of care)    Recommendations for Other Services      Precautions / Restrictions Precautions Precautions: Fall Precaution Comments: L sided chest tube Restrictions Weight Bearing Restrictions: No       Mobility Bed Mobility Overal bed mobility: Needs Assistance Bed Mobility: Sidelying to Sit   Sidelying to sit: Mod  assist          Transfers Overall transfer level: Needs assistance Equipment used: Rolling walker (2 wheeled) Transfers: Sit to/from Stand Sit to Stand: Min assist;Mod assist              Balance Overall balance assessment: Needs assistance Sitting-balance support: Bilateral upper extremity supported;Feet supported Sitting balance-Leahy Scale: Good     Standing balance support: Bilateral upper extremity supported Standing balance-Leahy Scale: Poor Standing balance comment: poor standing tolerance, requires MIN A for static standing and B UE support with RW throughout                           ADL either performed or assessed with clinical judgement   ADL Overall ADL's : Needs assistance/impaired     Grooming: Oral care;Sitting;Set up Grooming Details (indicate cue type and reason): MIN verbal cues to motivate/sequence this session, pt seems slightly more confused potentially?-informed RN and MD.                     Toileting- Clothing Manipulation and Hygiene: Moderate assistance;Sit to/from stand Toileting - Clothing Manipulation Details (indicate cue type and reason): MOD for peri care in standing, pt had episode of incontinence in bed. Was asking for [facility] prior to voiding when seen last by this therapist per nursing. Unsure if potentially r/t pt being depressed as his wife just suffered fall and is now in the hospital as well. Regardless-incontinence episode reported to RN.     Functional mobility during ADLs: Minimal assistance;Rolling walker(to take 5-6 shuffling side steps  to his L toward recliner)       Vision Patient Visual Report: No change from baseline     Perception     Praxis      Cognition Arousal/Alertness: Awake/alert Behavior During Therapy: Flat affect Overall Cognitive Status: Difficult to assess                                 General Comments: Pt with decreased mood this date (see above for situational  information that might be effecting mood), but in addition, pt with episode of incontinence. In addition, pt asks "what are we doing?" 3 times during session even though he was previously informed that plan for session is to t/f to chair. Differences in mentation reported to RN-could be more related to being distracted with situational stressors than true change in cognitive status. Difficult to assess.        Exercises Other Exercises Other Exercises: OT facilitates education re: benefits of OOB activity to motivate pt to mobility. Pt with moderate reception, some distraction/decreased attn to task this date.   Shoulder Instructions       General Comments      Pertinent Vitals/ Pain       Pain Assessment: Faces Faces Pain Scale: Hurts even more Pain Location: L chest tube site Pain Descriptors / Indicators: Aching;Discomfort;Sore;Tender Pain Intervention(s): Limited activity within patient's tolerance;Monitored during session;Repositioned  Home Living                                          Prior Functioning/Environment              Frequency  Min 1X/week        Progress Toward Goals  OT Goals(current goals can now be found in the care plan section)  Progress towards OT goals: OT to reassess next treatment(Pt demos decreased attn to task this date-potentially distracted d/t situational stressors, some potential confusion?)  Acute Rehab OT Goals Patient Stated Goal: To get better and back home OT Goal Formulation: With patient Time For Goal Achievement: 11/10/19 Potential to Achieve Goals: Good  Plan Discharge plan remains appropriate;Frequency remains appropriate    Co-evaluation                 AM-PAC OT "6 Clicks" Daily Activity     Outcome Measure   Help from another person eating meals?: None Help from another person taking care of personal grooming?: A Little Help from another person toileting, which includes using toliet,  bedpan, or urinal?: A Lot Help from another person bathing (including washing, rinsing, drying)?: A Lot Help from another person to put on and taking off regular upper body clothing?: None Help from another person to put on and taking off regular lower body clothing?: A Lot 6 Click Score: 17    End of Session Equipment Utilized During Treatment: Gait belt;Rolling walker  OT Visit Diagnosis: Muscle weakness (generalized) (M62.81);Pain Pain - Right/Left: Left Pain - part of body: (flank-chest tube site)   Activity Tolerance Patient limited by pain   Patient Left in chair;with call bell/phone within reach;with chair alarm set   Nurse Communication Mobility status;Other (comment)(notified RN of urinary incontinence episode in bed and pt with some short term memory issues during session: asks "what are we doing?" 3x during session.)  Time: [id number] OT Time Calculation (min): 26 min  Charges: OT General Charges $OT Visit: 1 Visit OT Treatments $Self Care/Home Management : 8-22 mins $Therapeutic Activity: 8-22 mins  Gerrianne Scale, MS, OTR/L ascom [phone] 10/30/19, 12:45 PM

## 2019-10-30 NOTE — Procedures (Signed)
Pre procedural Dx: Persistent PTX despite surgical chest tube Post procedural Dx: Same  Technically successful CT guided placed of a 12 Fr drainage catheter placement into the anterior apical aspect of the left pleural space yielding air.    EBL: Trace Complications: None immediate  David Bacon, MD Pager #: [phone]

## 2019-10-30 NOTE — Consult Note (Signed)
Chief Complaint: Persistent left-sided pneumothorax despite chest tube placement  Referring Physician(s): [facility]  Patient Status: [facility] - In-pt  History of Present Illness: David Cisneros is a 86 y.o. male history of diabetes, hypertension, rheumatoid arthritis with recent diagnosis of a malignant left-sided pleural effusion, post surgical placement of a left-sided chest tube with persistent pneumothorax.  Request made for placement of a new anterior approach chest tube in hopes of ultimately removing the poorly functioning surgical chest tube.  Patient again complains of fatigue and decreased energy level.  Patient is currently distracted as his wife has suffered a hip fracture and is currently in the emergency department.  Past Medical History:  Diagnosis Date  . Cancer ([place])    skin   . Chronic back pain   . Collagen vascular disease ([facility])    RA  . Diabetes mellitus without complication ([place])   . Hypertension     History reviewed. No pertinent surgical history.  Allergies: Sulfa antibiotics  Medications: Prior to Admission medications   Medication Sig Start Date End Date Taking? Authorizing Provider  finasteride (PROSCAR) 5 MG tablet Take 5 mg by mouth daily. 09/11/19  Yes [provider]  glimepiride (AMARYL) 4 MG tablet Take 4 mg by mouth 2 (two) times daily. 09/21/19  Yes [provider]  LINZESS 145 MCG CAPS capsule Take 145 mcg by mouth every morning. 10/01/19  Yes [provider]  metFORMIN (GLUCOPHAGE) 500 MG tablet Take 500 mg by mouth 2 (two) times daily. 09/28/19  Yes [provider]  predniSONE (DELTASONE) 5 MG tablet Take 10 mg by mouth daily. 10/09/19  Yes [provider]  XELJANZ 5 MG TABS Take 5 mg by mouth 2 (two) times daily. 10/06/19  Yes [provider]     Family History  Problem Relation Age of Onset  . Breast cancer Sister   . Heart attack Brother     Social History   Socioeconomic History   . Marital status: Married    Spouse name: Not on file  . Number of children: Not on file  . Years of education: Not on file  . Highest education level: Not on file  Occupational History  . Not on file  Tobacco Use  . Smoking status: Never Smoker  . Smokeless tobacco: Never Used  Substance and Sexual Activity  . Alcohol use: Not Currently  . Drug use: Not Currently  . Sexual activity: Not Currently  Other Topics Concern  . Not on file  Social History Narrative  . Not on file   Social Determinants of Health   Financial Resource Strain:   . Difficulty of Paying Living Expenses: Not on file  Food Insecurity:   . Worried About Charity fundraiser in the Last Year: Not on file  . Ran Out of Food in the Last Year: Not on file  Transportation Needs:   . Lack of Transportation (Medical): Not on file  . Lack of Transportation (Non-Medical): Not on file  Physical Activity:   . Days of Exercise per Week: Not on file  . Minutes of Exercise per Session: Not on file  Stress:   . Feeling of Stress : Not on file  Social Connections:   . Frequency of Communication with Friends and Family: Not on file  . Frequency of Social Gatherings with Friends and Family: Not on file  . Attends Religious Services: Not on file  . Active Member of Clubs or Organizations: Not on file  .  Attends Archivist Meetings: Not on file  . Marital Status: Not on file    ECOG Status: 2 - Symptomatic, <50% confined to bed  Review of Systems: A 12 point ROS discussed and pertinent positives are indicated in the HPI above.  All other systems are negative.  Review of Systems  Vital Signs: BP 135/66 (BP Location: Right Arm)   Pulse (!) 105   Temp 97.8 F (36.6 C) (Oral)   Resp 16   Ht 5\' 5"  (1.651 m)   Wt 67.1 kg   SpO2 95%   BMI 24.62 kg/m   Physical Exam  Imaging: CT ABDOMEN PELVIS WO CONTRAST  Result Date: 10/22/2019 CLINICAL DATA:  Abdominal pain. Abdominal distension. Nausea since 3  p.m. today. EXAM: CT ABDOMEN AND PELVIS WITHOUT CONTRAST TECHNIQUE: Multidetector CT imaging of the abdomen and pelvis was performed following the standard protocol without IV contrast. COMPARISON:  Chest radiograph and chest CT of 1 day prior. Most recent abdominopelvic CT of 08/06/2019 FINDINGS: Lower chest: Left lower and posterior left upper lobe patchy consolidation. Left pleural drain/chest tube in place with small but incompletely imaged left-sided pneumothorax. This was detailed on yesterday's chest radiograph. Normal heart size. Multivessel coronary artery atherosclerosis. Hepatobiliary: Normal liver. Small gallstones. Borderline to mild gallbladder distension. No surrounding edema or biliary duct dilatation. Pancreas: Normal pancreas for age, without duct dilatation or acute inflammation. Spleen: Normal in size, without focal abnormality. Adrenals/Urinary Tract: Normal adrenal glands. Mild renal cortical thinning bilaterally. No renal calculi or hydronephrosis. No hydroureter or ureteric calculi. Mild bladder distension. Stomach/Bowel: Mild gaseous distension of the stomach. Primarily gas-filled colon, including at up to 5.8 cm. The descending duodenum is relatively normal in caliber. There is a transverse duodenal diverticulum. Diffuse gas-filled loops of small bowel, on the order of maximally 2.3 cm. No pneumatosis or free intraperitoneal air. Vascular/Lymphatic: Aortic and branch vessel atherosclerosis. No abdominopelvic adenopathy. Reproductive: Normal prostate. Other: No significant free fluid. Tiny fat containing ventral abdominal wall hernia on 39/2. Musculoskeletal: Right acetabular sclerotic lesion is likely a bone island. Lumbosacral spondylosis with mild convex right lumbar spine curvature. IMPRESSION: 1. Mild gaseous distension of the stomach with gas-filled upper normal large and small bowel loops. Favor adynamic ileus. 2. Cholelithiasis with borderline gallbladder distension, but no specific  evidence of acute cholecystitis. 3. Left base airspace disease, favoring pneumonia. Left-sided small bore chest tube in place with small pneumothorax, as on yesterday's chest radiograph. 4. Coronary artery atherosclerosis. Aortic Atherosclerosis (ICD10-I70.0). 5. Borderline bladder distension, without hydronephrosis. Electronically Signed   By: Abigail Miyamoto M.D.   On: 10/22/2019 17:56   DG Chest 2 View  Result Date: 10/28/2019 CLINICAL DATA:  Left chest tube EXAM: CHEST - 2 VIEW COMPARISON:  Two days ago FINDINGS: Left basal chest tube in stable position. Small left pneumothorax measuring 10-20%, seen at the apex, medially, and at the base. The right lung is clear. Stable heart size. IMPRESSION: 10-20% left pneumothorax seen at the apex and base. No definite change from 2 days ago. Electronically Signed   By: Monte Fantasia M.D.   On: 10/28/2019 08:19   DG Chest 2 View  Result Date: 10/21/2019 CLINICAL DATA:  86 year old male with shortness of breath. Patient is not able to raise his left arm. EXAM: CHEST - 2 VIEW COMPARISON:  Chest radiograph dated 10/02/2014. FINDINGS: There is a large left pleural effusion with compressive atelectasis of the majority of the left lung. A small aerated portion of the left upper lobe remains.  The right lung is clear. No pneumothorax. The cardiac borders are silhouetted. Atherosclerotic calcification of the aorta. No acute osseous pathology. IMPRESSION: Large left pleural effusion with compressive atelectasis of the majority of the left lung. Pneumonia is not excluded. Electronically Signed   By: Anner Crete M.D.   On: 10/21/2019 11:24   CT Chest Wo Contrast  Result Date: 10/25/2019 CLINICAL DATA:  Evaluate for persistent pneumothorax. EXAM: CT CHEST WITHOUT CONTRAST TECHNIQUE: Multidetector CT imaging of the chest was performed following the standard protocol without IV contrast. COMPARISON:  10/27/2018 FINDINGS: Cardiovascular: The heart size appears mildly  enlarged. Aortic atherosclerosis. Left main, lad, left circumflex and RCA coronary artery atherosclerotic calcifications. No pericardial effusion identified. Mediastinum/Nodes: Normal appearance of the thyroid gland. The trachea appears patent and is midline. Normal appearance of the esophagus. No supraclavicular, axillary, mediastinal, or hilar adenopathy. Lungs/Pleura: Left basilar chest tube is in place entering from a left lateral chest wall approach. Diffuse irregular and nodular pleural thickening is identified. A small volume of pleural fluid overlying the posterior left lung, image 55/2. Moderate left loculated pneumothorax is identified with multiple points of suspect adhesion to the chest wall. Within the posterior left upper lobe there is a lung nodule which measures 1.8 x 2.3 cm, image 47/4. Pleural base soft tissue nodule overlying the anterior left upper lobe measures 2.7 x 1.6 cm, image 38/2. Calcified granuloma identified within the posterior right upper lobe. Upper Abdomen: No acute abnormality identified. Gallstones. Aortic atherosclerosis. Musculoskeletal: No chest wall mass or suspicious bone lesions identified. IMPRESSION: 1. Left basilar chest tube is in place entering from a left lateral chest wall approach. There is a moderate loculated left pneumothorax with multiple points of suspected adhesion to the chest wall. There is been considerable decrease in previously noted left pleural fluid. 2. Posterior and anterior left upper lobe soft tissue nodules are identified, suspicious for malignancy. Additionally, there is mild diffuse irregular thickening and nodularity of the left lung pleura. Findings are suspicious for malignancy. 3. Aortic atherosclerosis. Multi vessel coronary artery calcifications noted. 4. Gallstones. Aortic Atherosclerosis (ICD10-I70.0). Electronically Signed   By: Kerby Moors M.D.   On: 11/05/2019 13:13   CT Chest Wo Contrast  Result Date: 10/21/2019 CLINICAL DATA:   Abnormal x-ray EXAM: CT CHEST WITHOUT CONTRAST TECHNIQUE: Multidetector CT imaging of the chest was performed following the standard protocol without IV contrast. COMPARISON:  None. FINDINGS: Cardiovascular: Normal heart size. Coronary artery calcification. No significant pericardial effusion. Calcified plaque along the thoracic aorta. Mediastinum/Nodes: Rightward mediastinal shift. No mediastinal or axillary adenopathy. Unremarkable thyroid. Lungs/Pleura: Large left pleural effusion with near complete atelectasis of the left lung. There is partial aeration of the left upper lobe. Right lung remains aerated with chronic interstitial changes at the lung base. Upper Abdomen: No acute abnormality. Musculoskeletal: No chest wall mass or significant osseous abnormality. IMPRESSION: Large left pleural effusion with compressive atelectasis of the majority of the left lung. Aortic Atherosclerosis (ICD10-I70.0). Electronically Signed   By: Macy Mis M.D.   On: 10/21/2019 12:35   NM Hepato W/EjeCT Fract  Result Date: 10/24/2019 CLINICAL DATA:  Abdominal pain. Abnormal right upper quadrant ultrasound. EXAM: NUCLEAR MEDICINE HEPATOBILIARY IMAGING TECHNIQUE: Sequential images of the abdomen were obtained out to 60 minutes following intravenous administration of radiopharmaceutical. RADIOPHARMACEUTICALS:  5.465 mCi Tc-73m  Choletec IV COMPARISON:  Ultrasound 11/21/2018, CT 11/21/2018 FINDINGS: Prompt uptake and biliary excretion of activity by the liver is seen. Gallbladder activity is visualized, consistent with patency of cystic duct.  Biliary activity passes into small bowel, consistent with patent common bile duct. Gallbladder ejection fraction 28%. IMPRESSION: 1. Patent cystic and common bile ducts. No evidence of acute cholecystitis. 2. Decreased gallbladder ejection fraction of 28% suggesting gallbladder dysfunction. Electronically Signed   By: Davina Poke D.O.   On: 10/24/2019 17:11   DG Chest Port 1  View  Result Date: 10/26/2019 CLINICAL DATA:  Status post chest tube placement. EXAM: PORTABLE CHEST 1 VIEW COMPARISON:  10/24/2019 FINDINGS: The cardiomediastinal silhouette is unchanged. A left basilar chest tube remains in place. A small left apical pneumothorax is unchanged. Patchy left basilar airspace opacity has mildly improved. The right lung remains clear. No sizable pleural effusion is identified. IMPRESSION: 1. Unchanged small left apical pneumothorax with chest tube in place. 2. Mildly improved left basilar lung aeration. Electronically Signed   By: Logan Bores M.D.   On: 10/26/2019 14:27   DG Chest Port 1 View  Result Date: 10/24/2019 CLINICAL DATA:  Pneumothorax, left side EXAM: PORTABLE CHEST 1 VIEW COMPARISON:  Chest radiograph 10/23/2019 at 10:40 a.m. FINDINGS: Stable cardiomediastinal contours. Interval removal of a nasogastric tube. Left-sided chest tube remains in place. Stable appearance of a small left-sided pneumothorax. Persistent heterogeneous opacities at the left lung base. The right lung is clear. No significant pleural effusion. IMPRESSION: Interval removal of a nasogastric tube. Otherwise stable chest with small left-sided pneumothorax and left basilar airspace disease. Electronically Signed   By: Audie Pinto M.D.   On: 10/24/2019 09:31   DG Chest Port 1 View  Result Date: 10/23/2019 CLINICAL DATA:  Shortness of breath. Left-sided chest tube for 2 days. EXAM: PORTABLE CHEST 1 VIEW COMPARISON:  10/21/2019 FINDINGS: Nasogastric tube terminates at the body of the stomach. Left-sided chest tube remains in place. Midline trachea. Normal heart size. Numerous leads and wires project over the chest. No pleural fluid. 5% left apical and inferolateral pneumothorax are not significantly changed. Persistent left base airspace disease. IMPRESSION: No significant change since 10/21/2019 Left chest tube in place with similar 5% left-sided pneumothorax. Similar left base airspace disease.  Interval placement of nasogastric tube. Electronically Signed   By: Abigail Miyamoto M.D.   On: 10/23/2019 11:03   DG Chest Portable 1 View  Result Date: 10/21/2019 CLINICAL DATA:  Left-sided chest tube placement. EXAM: PORTABLE CHEST 1 VIEW COMPARISON:  CT chest and chest x-ray from same day. FINDINGS: New left-sided chest tube at the lung base. Resolved left pleural effusion. Small left pneumothorax. Hazy density throughout the left lung. The right lung is clear. Normal heart size. No acute osseous abnormality. IMPRESSION: 1. Interval placement of a left-sided chest tube with resolved left pleural effusion. 2. Small left pneumothorax, presumably ex vacuo due to incomplete re-expansion of the left lung. 3. Hazy density throughout the left lung could reflect atelectasis and/or re-expansion pulmonary edema. Electronically Signed   By: Titus Dubin M.D.   On: 10/21/2019 14:11   US Abdomen Limited RUQ  Result Date: 10/22/2019 CLINICAL DATA:  Abdominal pain, distended gallbladder on CT EXAM: ULTRASOUND ABDOMEN LIMITED RIGHT UPPER QUADRANT COMPARISON:  CT 10/22/2019 FINDINGS: Gallbladder: Sludge within the gallbladder focal echogenic area along the anterior wall of the gallbladder measuring 1.9 cm. Increased gallbladder wall thickness at 8.6 mm. Negative sonographic Murphy. Small amount of pericholecystic fluid. Common bile duct: Diameter: 5.1 mm Liver: Limited visualization. Liver is echogenic. No gross focal hepatic abnormality. Portal vein is patent on color Doppler imaging with normal direction of blood flow towards the liver. Other: None.  IMPRESSION: 1. Sludge within the gallbladder, with thickened gallbladder wall, pericholecystic fluid, but negative sonographic Murphy. Acalculous cholecystitis remains a concern, although liver disease and other edema forming states can also produce thickening of the gallbladder wall. Correlation with nuclear medicine hepatobiliary imaging may be considered. Additional  finding of 1.9 cm echogenic mass along the anterior wall of the gallbladder, either representing tumefactive sludge or potentially large polyp. 2. Limited visualization of the liver secondary to patient mobility. Liver is echogenic consistent with steatosis and or hepatocellular disease. Electronically Signed   By: Donavan Foil M.D.   On: 10/22/2019 19:14    Labs:  CBC: Recent Labs    10/27/19 0551 10/28/19 0621 10/31/2019 0554 10/30/19 0441  WBC 12.6* 15.4* 15.5* 12.9*  HGB 10.3* 10.2* 10.7* 10.5*  HCT 32.2* 31.9* 33.5* 33.0*  PLT 474* 424* 380 379    COAGS: Recent Labs    10/21/19 1353 10/21/19 2205 10/22/19 1946 10/30/19 0441  INR 1.1  --  1.2 1.2  APTT 32 32 33 30    BMP: Recent Labs    10/26/19 0527 10/27/19 0551 10/28/19 0621 11/03/2019 0554  NA 136 137 137 138  K 4.5 3.8 4.5 4.4  CL 93* 96* 98 99  CO2 31 29 29 27   GLUCOSE 149* 161* 158* 156*  BUN 38* 44* 35* 39*  CALCIUM 8.8* 8.4* 8.4* 8.4*  CREATININE 1.21 1.13 1.10 1.07  GFRNONAA 54* 59* >60 >60  GFRAA >60 >60 >60 >60    LIVER FUNCTION TESTS: Recent Labs    10/22/19 1946 10/24/19 0559  BILITOT 0.7 0.9  AST 16 14*  ALT 27 20  ALKPHOS 59 53  PROT 6.2* 6.1*  ALBUMIN 2.7* 2.7*    TUMOR MARKERS: No results for input(s): AFPTM, CEA, CA199, CHROMGRNA in the last 8760 hours.  Assessment and Plan:  David Cisneros is a 86 y.o. male history of diabetes, hypertension, rheumatoid arthritis with recent diagnosis of a malignant left-sided pleural effusion, post surgical placement of a left-sided chest tube with persistent pneumothorax.  Request made for placement of a new anterior approach chest tube in hopes of ultimately removing the poorly functioning surgical chest tube.  .Risks and benefits of CT-guided left-sided chest tube placement was discussed with the patient including bleeding, infection, damage to adjacent structures, bowel perforation/fistula connection, and sepsis.  All of the patient's  questions were answered, patient is agreeable to proceed.  Consent signed and in chart.  Thank you for this interesting consult.  I greatly enjoyed meeting David Cisneros and look forward to participating in their care.  A copy of this report was sent to the requesting provider on this date.  Electronically Signed: Sandi Mariscal, MD 10/30/2019, 1:52 PM   I spent a total of 20 Minutes in face to face in clinical consultation, greater than 50% of which was counseling/coordinating care for CT-guided left-sided chest tube placement

## 2019-10-31 ENCOUNTER — Inpatient Hospital Stay: Payer: Medicare Other

## 2019-10-31 LAB — GLUCOSE, CAPILLARY
Glucose-Capillary: 198 mg/dL — ABNORMAL HIGH (ref 70–99)
Glucose-Capillary: 267 mg/dL — ABNORMAL HIGH (ref 70–99)
Glucose-Capillary: 266 mg/dL — ABNORMAL HIGH (ref 70–99)
Glucose-Capillary: 206 mg/dL — ABNORMAL HIGH (ref 70–99)

## 2019-10-31 MED ORDER — SODIUM CHLORIDE 0.9% FLUSH
3.0000 mL | Freq: Two times a day (BID) | INTRAVENOUS | Status: DC
  Administered 2019-10-31 – 2019-11-03 (×3): 3 mL via INTRAVENOUS

## 2019-10-31 MED ORDER — MAGNESIUM CITRATE PO SOLN
1.0000 | Freq: Once | ORAL | Status: AC
  Administered 2019-10-31: 1 via ORAL
  Filled 2019-10-31: qty 296

## 2019-10-31 MED ORDER — SODIUM CHLORIDE 0.9% FLUSH
3.0000 mL | INTRAVENOUS | Status: DC | PRN
  Administered 2019-10-31: 3 mL via INTRAVENOUS

## 2019-10-31 NOTE — Progress Notes (Signed)
Pt transferred to 1A Rm 140 by nursing staff of [facility]. Pt chest tubes intact. Pt medicated for pain before transport. Pt happy to be able to be with his wife. Son in the room with wife at arrival. [facility] nursing staff there to meet with [facility] staff and able to assess chest tubes on arrival.

## 2019-10-31 NOTE — Progress Notes (Signed)
  David Cisneros was moved to a semiprivate room overnight.  With his wife who was admitted with a fractured hip.  He did undergo placement of a percutaneous catheter yesterday and his prior chest tube was clamped.  His chest x-ray this morning was independently reviewed and does not reveal any obvious pneumothorax or pleural effusion.  He does continue to complain of pain at the chest tube site.  His lungs are clear on the right and have coarse rhonchi on the left.  There are also some mechanical sounds related to the chest tube itself.  His heart is regular.  The chest tube sites are clean dry and intact.  I did remove the chest tube that had been previously placed.  Sterile dressings were applied.  I had a long discussion with the son today.  I reviewed with the son the results of the CT scan and went over those with him.  At the present time he does not believe that his father will tolerate any sort of therapy and therefore he would refuse chemotherapy if it were offered.  I also discussed his care with Dr. Reesa Chew.  A CT-guided needle biopsy of the lung may be entertained if there is a need to establish the diagnosis prior to him beginning any therapy.  If he refuses any types of therapy then a diagnosis does not need to be established.

## 2019-10-31 NOTE — Progress Notes (Signed)
PROGRESS NOTE    David Cisneros  [id number] DOB: 01-27-1931 DOA: 10/21/2019 PCP: Valera Castle, MD   Brief Narrative:  David Cisneros a 86 y.o.malewith medical history significant offormer smoker, hypertension, diabetes mellitus, rheumatoid arthritis, chronic back pain, CKD stage IIIa, who presents with shortness of breath. Found to have large left-sided pleural effusion.  Chest tube was placed by cardiothoracic surgery.  Lateral chest tube removed on 10/31/2019 and another anterior smaller bore chest tube was placed by IR on 10/30/2019. Thought to be due to rheumatoid arthritis.  Repeat CT chest concerning for underlying malignancy. Also developed ileus-initially managed with NG tube which was pulled out by patient on 10/24/2019.  Subjective: Patient was complaining of left-sided chest.  He was transferred in the room with his wife.  His lateral chest tube was being removed by thoracic surgery when seen this morning.  Patient tolerated the procedure well. He was also accompanied by his son in the room.  Assessment & Plan:   Principal Problem:   Pleural effusion on left Active Problems:   Hyperkalemia   Hypertension   Chronic back pain   CKD (chronic kidney disease), stage IIIa   Leukocytosis   Pneumothorax on left   Rheumatoid arthritis (HCC)   Malnutrition of moderate degree  Left pleural effusion:Etiology unclear, likely secondary to underlying malignancy, he needs a biopsy, cardiothoracic surgeon has a long discussion with patient and son regarding getting a biopsy for tissue diagnosis.  Son does not think that his dad will be able to tolerate chemotherapy.  They will decide whether they want to pursue tissue diagnosis or not at this time as it will be fruitless if they do not want any further treatment.  S/p left chest tube placement. Per EDP,approximately 3 L of bloody fluid were removed. Pleural fluid analysis is lymphocyte predominant, cytology is neg for  malignancy, Rheumatoid factors negative. Pleural fluid cx NGTD.  Left lateral chest tube was clamped yesterday and another smaller anterior chest tube was placed by IR.  Left lateral tube was removed this morning by Dr. Christia Reading.  Patient will go home with anterior chest tube.  Repeat CT chest shows improvement in pleural effusion and pneumothorax but there was some nodules and pleural irregularities concerning for malignancy.  -Morphine prn for pain.  -Thoracic surgery following-appreciate their recommendations.   Pneumothorax on left: Mostly resolved. S/p left chest tube & draining bloody fluid.   Managed as per surgery.   Hyperkalemia: resolved.  Will continue to monitor   Ileus: etiology unclear. Pt pulled out NG tube 10/24/19 and the NG tube was left out. Evidently pt is passing gas but still no bowel movement yet. Continue on colace, dulcolax, milk of Mg & will give polyethylene glycol x1 today. No bowel movements yet. Possible bloody bilious vomiting on evening on 10/22/19.  H&H are stable. GI signed off. Patient had a small BM after soapsuds enema . Patient is eating okay, no nausea or vomiting.  Abdomen was soft. -Increase the dose of milk of magnesia. -Soapsuds enema. -Continue with other bowel regimen..  Rheumatoid arthritis: at home onXeljanz and prednisone. He was initially treated with IV Solu-Medrol. -Start him on increased dose of prednisone 20 mg daily.  AKI on CKDIIIa. Improved to 1.1 today. -Continue to monitor.  Hypertension.  Not on any antihypertensives. Currently normotensive. -Monitor with as needed hydralazine.  Chronic back pain. -Continue morphine as needed.  Leukocytosis.  Most likely secondary to steroid use. Patient remained afebrile. -Continue monitoring.  Generalized  weakness.  PT recommending SNF. We had a discussion with son and as his wife recently broke her hip and had a surgery yesterday.  We will try getting maximum home health services to  see if they both can stay at home.  Objective: Vitals:   10/30/19 2240 10/31/19 0417 10/31/19 0802 10/31/19 1212  BP: (!) 144/73 137/74 (!) 142/79 125/71  Pulse: (!) 106 95 99 88  Resp: 20  19 16   Temp: 99.2 F (37.3 C)  98.2 F (36.8 C) 98.3 F (36.8 C)  TempSrc:   Oral Oral  SpO2: 95% 96% 96% 95%  Weight:      Height:        Intake/Output Summary (Last 24 hours) at 10/31/2019 1226 Last data filed at 10/31/2019 0430 Gross per 24 hour  Intake --  Output 520 ml  Net -520 ml   Filed Weights   10/21/19 1050 10/22/19 2241 10/28/19 1509  Weight: 72.6 kg 67.5 kg 67.1 kg    Examination:  General exam: Appears calm and comfortable  Respiratory system: Left-sided rhonchi, left-sided anterior chest tube in place respiratory effort normal. Cardiovascular system: S1 & S2 heard, RRR. No JVD, murmurs, rubs, gallops or clicks. Gastrointestinal system: Soft, nontender, nondistended, bowel sounds positive. Central nervous system: Alert and oriented. No focal neurological deficits.Symmetric 5 x 5 power. Extremities: No edema, no cyanosis, pulses intact and symmetrical. Psychiatry: Judgement and insight appear normal.    DVT prophylaxis: SCDs Code Status: DNR Family Communication: Son and wife both are present in the room. Disposition Plan: Pending improvement.  Consultants:   Cardiothoracic surgery  GI  IR  Procedures:   S/p chest tube placement on 10/21/19; s/p NG tube placement on 10/22/19 & pt pulled out NG tube 10/24/19  Lateral chest tube removed on 10/31/2019  Anterior chest tube placed on 10/30/2019   Antimicrobials:   Data Reviewed: I have personally reviewed following labs and imaging studies  CBC: Recent Labs  Lab 10/26/19 0527 10/27/19 0551 10/28/19 0621 11/01/2019 0554 10/30/19 0441  WBC 14.4* 12.6* 15.4* 15.5* 12.9*  HGB 10.3* 10.3* 10.2* 10.7* 10.5*  HCT 30.5* 32.2* 31.9* 33.5* 33.0*  MCV 87.6 91.5 92.2 92.5 93.0  PLT 518* 474* 424* 380 [id number]   Basic  Metabolic Panel: Recent Labs  Lab 10/25/19 0542 10/26/19 0527 10/27/19 0551 10/28/19 0621 10/31/2019 0554  NA 136 136 137 137 138  K 4.5 4.5 3.8 4.5 4.4  CL 92* 93* 96* 98 99  CO2 31 31 29 29 27   GLUCOSE 184* 149* 161* 158* 156*  BUN 49* 38* 44* 35* 39*  CREATININE 1.17 1.21 1.13 1.10 1.07  CALCIUM 8.5* 8.8* 8.4* 8.4* 8.4*  MG 2.6* 2.2 2.2 2.0  --   PHOS 3.6 3.4 3.3 3.1  --    GFR: Estimated Creatinine Clearance: 43.1 mL/min (by C-G formula based on SCr of 1.07 mg/dL). Liver Function Tests: No results for input(s): AST, ALT, ALKPHOS, BILITOT, PROT, ALBUMIN in the last 168 hours. No results for input(s): LIPASE, AMYLASE in the last 168 hours. No results for input(s): AMMONIA in the last 168 hours. Coagulation Profile: Recent Labs  Lab 10/30/19 0441  INR 1.2   Cardiac Enzymes: No results for input(s): CKTOTAL, CKMB, CKMBINDEX, TROPONINI in the last 168 hours. BNP (last 3 results) No results for input(s): PROBNP in the last 8760 hours. HbA1C: No results for input(s): HGBA1C in the last 72 hours. CBG: Recent Labs  Lab 10/30/19 1152 10/30/19 1653 10/30/19 2002 10/31/19 0806 10/31/19 1218  GLUCAP 177* 170* 240* 198* 267*   Lipid Profile: No results for input(s): CHOL, HDL, LDLCALC, TRIG, CHOLHDL, LDLDIRECT in the last 72 hours. Thyroid Function Tests: No results for input(s): TSH, T4TOTAL, FREET4, T3FREE, THYROIDAB in the last 72 hours. Anemia Panel: No results for input(s): VITAMINB12, FOLATE, FERRITIN, TIBC, IRON, RETICCTPCT in the last 72 hours. Sepsis Labs: No results for input(s): PROCALCITON, LATICACIDVEN in the last 168 hours.  Recent Results (from the past 240 hour(s))  Respiratory Panel by RT PCR (Flu A&B, Covid) - Nasopharyngeal Swab     Status: None   Collection Time: 10/21/19 12:33 PM   Specimen: Nasopharyngeal Swab  Result Value Ref Range Status   SARS Coronavirus 2 by RT PCR NEGATIVE NEGATIVE Final    Comment: (NOTE) SARS-CoV-2 target nucleic acids  are NOT DETECTED. The SARS-CoV-2 RNA is generally detectable in upper respiratoy specimens during the acute phase of infection. The lowest concentration of SARS-CoV-2 viral copies this assay can detect is 131 copies/mL. A negative result does not preclude SARS-Cov-2 infection and should not be used as the sole basis for treatment or other patient management decisions. A negative result may occur with  improper specimen collection/handling, submission of specimen other than nasopharyngeal swab, presence of viral mutation(s) within the areas targeted by this assay, and inadequate number of viral copies (<131 copies/mL). A negative result must be combined with clinical observations, patient history, and epidemiological information. The expected result is Negative. Fact Sheet for Patients:  PinkCheek.be Fact Sheet for Healthcare Providers:  GravelBags.it This test is not yet ap proved or cleared by the [place] FDA and  has been authorized for detection and/or diagnosis of SARS-CoV-2 by FDA under an Emergency Use Authorization (EUA). This EUA will remain  in effect (meaning this test can be used) for the duration of the COVID-19 declaration under Section 564(b)(1) of the Act, 21 U.S.C. section 360bbb-3(b)(1), unless the authorization is terminated or revoked sooner.    Influenza A by PCR NEGATIVE NEGATIVE Final   Influenza B by PCR NEGATIVE NEGATIVE Final    Comment: (NOTE) The Xpert Xpress SARS-CoV-2/FLU/RSV assay is intended as an aid in  the diagnosis of influenza from Nasopharyngeal swab specimens and  should not be used as a sole basis for treatment. Nasal washings and  aspirates are unacceptable for Xpert Xpress SARS-CoV-2/FLU/RSV  testing. Fact Sheet for Patients: PinkCheek.be Fact Sheet for Healthcare Providers: GravelBags.it This test is not yet approved or  cleared by the [place] FDA and  has been authorized for detection and/or diagnosis of SARS-CoV-2 by  FDA under an Emergency Use Authorization (EUA). This EUA will remain  in effect (meaning this test can be used) for the duration of the  Covid-19 declaration under Section 564(b)(1) of the Act, 21  U.S.C. section 360bbb-3(b)(1), unless the authorization is  terminated or revoked. Performed at [facility], [address] [place], [place] [address]   Body fluid culture (includes gram stain)     Status: None   Collection Time: 10/21/19  1:53 PM   Specimen: Pleural Fluid  Result Value Ref Range Status   Specimen Description   Final    PLEURAL Performed at [facility], [address], [place], [place] [address]    Special Requests   Final    NONE Performed at [facility], [address] [place], [place] [address]    Gram Stain   Final    RARE WBC PRESENT, PREDOMINANTLY PMN NO ORGANISMS SEEN  Culture   Final    NO GROWTH 3 DAYS Performed at [facility], [place] [address], [place], [place] [address]    Report Status 10/26/2019 FINAL  Final  CULTURE, BLOOD (ROUTINE X 2) w Reflex to ID Panel     Status: None   Collection Time: 10/21/19 10:04 PM   Specimen: BLOOD  Result Value Ref Range Status   Specimen Description BLOOD RIGHT ANTECUBITAL  Final   Special Requests   Final    BOTTLES DRAWN AEROBIC AND ANAEROBIC Blood Culture results may not be optimal due to an excessive volume of blood received in culture bottles   Culture   Final    NO GROWTH 5 DAYS Performed at [facility], [address] [place], [place] [address]    Report Status 10/26/2019 FINAL  Final  CULTURE, BLOOD (ROUTINE X 2) w Reflex to ID Panel     Status: None   Collection Time: 10/21/19 10:04 PM   Specimen: BLOOD  Result Value Ref Range Status   Specimen Description BLOOD RIGHT ANTECUBITAL  Final   Special Requests   Final    BOTTLES DRAWN AEROBIC AND  ANAEROBIC Blood Culture results may not be optimal due to an excessive volume of blood received in culture bottles   Culture   Final    NO GROWTH 5 DAYS Performed at [facility], [address] [place], [place] [address]    Report Status 10/26/2019 FINAL  Final  MRSA PCR Screening     Status: None   Collection Time: 10/22/19 10:53 PM   Specimen: Nasal Mucosa; Nasopharyngeal  Result Value Ref Range Status   MRSA by PCR NEGATIVE NEGATIVE Final    Comment:        The GeneXpert MRSA Assay (FDA approved for NASAL specimens only), is one component of a comprehensive MRSA colonization surveillance program. It is not intended to diagnose MRSA infection nor to guide or monitor treatment for MRSA infections. Performed at [facility], [address] [place], [place] [address]      Radiology Studies: DG Chest 2 View  Result Date: 10/31/2019 CLINICAL DATA:  Chest tube in place. EXAM: CHEST - 2 VIEW COMPARISON:  Chest CT 11/10/2019 and chest radiographs 10/28/2019 FINDINGS: A left basilar chest tube remains in place posteriorly, and there is a new left pigtail chest tube anteriorly. The cardiomediastinal silhouette is unchanged. There is a persistent small loculated left pneumothorax which is most notable medially. Increased left basilar density likely reflects a small left pleural effusion and atelectasis. No consolidative opacity is present in the right lung. IMPRESSION: 1. Interval placement of a second left-sided chest tube with persistent small loculated left pneumothorax. 2. Left basilar atelectasis and small pleural effusion. Electronically Signed   By: Logan Bores M.D.   On: 10/31/2019 09:28   CT Chest Wo Contrast  Result Date: 10/23/2019 CLINICAL DATA:  Evaluate for persistent pneumothorax. EXAM: CT CHEST WITHOUT CONTRAST TECHNIQUE: Multidetector CT imaging of the chest was performed following the standard protocol without IV contrast. COMPARISON:  10/27/2018  FINDINGS: Cardiovascular: The heart size appears mildly enlarged. Aortic atherosclerosis. Left main, lad, left circumflex and RCA coronary artery atherosclerotic calcifications. No pericardial effusion identified. Mediastinum/Nodes: Normal appearance of the thyroid gland. The trachea appears patent and is midline. Normal appearance of the esophagus. No supraclavicular, axillary, mediastinal, or hilar adenopathy. Lungs/Pleura: Left basilar chest tube is in place entering from a left lateral chest wall approach. Diffuse irregular and nodular pleural thickening  is identified. A small volume of pleural fluid overlying the posterior left lung, image 55/2. Moderate left loculated pneumothorax is identified with multiple points of suspect adhesion to the chest wall. Within the posterior left upper lobe there is a lung nodule which measures 1.8 x 2.3 cm, image 47/4. Pleural base soft tissue nodule overlying the anterior left upper lobe measures 2.7 x 1.6 cm, image 38/2. Calcified granuloma identified within the posterior right upper lobe. Upper Abdomen: No acute abnormality identified. Gallstones. Aortic atherosclerosis. Musculoskeletal: No chest wall mass or suspicious bone lesions identified. IMPRESSION: 1. Left basilar chest tube is in place entering from a left lateral chest wall approach. There is a moderate loculated left pneumothorax with multiple points of suspected adhesion to the chest wall. There is been considerable decrease in previously noted left pleural fluid. 2. Posterior and anterior left upper lobe soft tissue nodules are identified, suspicious for malignancy. Additionally, there is mild diffuse irregular thickening and nodularity of the left lung pleura. Findings are suspicious for malignancy. 3. Aortic atherosclerosis. Multi vessel coronary artery calcifications noted. 4. Gallstones. Aortic Atherosclerosis (ICD10-I70.0). Electronically Signed   By: Kerby Moors M.D.   On: 11/19/2019 13:13   CT IMAGE  GUIDED DRAINAGE BY PERCUTANEOUS CATHETER  Result Date: 10/30/2019 INDICATION: History of malignant pleural effusion, now with persistent pneumothorax despite surgically placed chest tube. Request made for placement of a new anterior approach chest tube within the more apical component of the persistent likely partially loculated left-sided pneumothorax. EXAM: CT IMAGE GUIDED DRAINAGE BY PERCUTANEOUS CATHETER COMPARISON:  None. MEDICATIONS: The patient is currently admitted to the hospital and receiving intravenous antibiotics. The antibiotics were administered within an appropriate time frame prior to the initiation of the procedure. ANESTHESIA/SEDATION: Moderate (conscious) sedation was employed during this procedure. A total of Versed 1 mg and Fentanyl 50 mcg was administered intravenously. Moderate Sedation Time: 14 minutes. The patient's level of consciousness and vital signs were monitored continuously by radiology nursing throughout the procedure under my direct supervision. CONTRAST:  None COMPLICATIONS: None immediate. PROCEDURE: Informed written consent was obtained from the patient after a discussion of the risks, benefits and alternatives to treatment. The patient was placed supine on the CT gantry and a pre procedural CT was performed re-demonstrating the known small loculated left-sided pleural effusion. The procedure was planned utilizing the requested left anterior apical approach. A timeout was performed prior to the initiation of the procedure. The skin overlying the mid clavicular line of the left chest was prepped and draped in the usual sterile fashion. The overlying soft tissues were anesthetized with 1% lidocaine with epinephrine. An 18 gauge trocar needle was advanced into the left pleural space and a short Amplatz super stiff wire was coiled within the pleural space. Track was dilated and drainage catheter was placed however imaging demonstrated malpositioning of the drainage catheter  located within the anterior mediastinum. As such, the initial drainage catheter was removed intact. Planning imaging was negative for evidence of a mediastinal injury. As such, the next caudal anterior intercostal space was selected and again, after the overlying soft tissues were anesthetized with 1% lidocaine with epinephrine, an 18 gauge trocar needle was advanced into the left pleural space and a short Amplatz wire was coiled within the left pleural space. Appropriate position was confirmed with CT imaging. The track was dilated ultimately allowing placement of a 12 French drainage catheter with end ultimately coiled and locked appropriately within the anterior apical aspect of the left pleural space. The drainage  catheter was connected to a pleura vac device and secured in place within interrupted suture and a Stat Lock device. A Vaseline gauze was applied. The patient tolerated the procedure well without immediate postprocedural complication. IMPRESSION: Successful CT guided placement of a 12 French all purpose drain catheter into the anterior apical aspect of the left pleural space. Further chest tube management, both of the CT-guided chest tube as well as the pre-existing surgical chest tube, at the discretion of the providing cardiothoracic surgery service. Electronically Signed   By: Sandi Mariscal M.D.   On: 10/30/2019 15:39    Scheduled Meds: . bisacodyl  10 mg Rectal Once  . dextromethorphan-guaiFENesin  1 tablet Oral BID  . docusate sodium  200 mg Oral BID  . finasteride  5 mg Oral Daily  . insulin aspart  0-5 Units Subcutaneous QHS  . insulin aspart  0-9 Units Subcutaneous TID WC  . insulin aspart  4 Units Subcutaneous TID WC  . linaclotide  145 mcg Oral q morning - 10a  . magnesium hydroxide  30 mL Oral Daily  . multivitamin with minerals  1 tablet Oral Daily  . pantoprazole (PROTONIX) IV  40 mg Intravenous Q24H  . predniSONE  20 mg Oral Daily  . Ensure Max Protein  11 oz Oral BID BM  .  scopolamine  1 patch Transdermal Q72H  . Tofacitinib Citrate  5 mg Oral BID   Continuous Infusions:   LOS: 9 days   Time spent: 35 min  Lorella Nimrod, MD [facility] Pager [phone]  If 7PM-7AM, please contact night-coverage [email or web address] Password [facility] 10/31/2019, 12:26 PM   This record has been created using Systems analyst. Errors have been sought and corrected,but may not always be located. Such creation errors do not reflect on the standard of care.

## 2019-10-31 NOTE — Progress Notes (Signed)
Left lateral chest tube removal site dsg intact but saturated with no changes in respiratory status. ABD pads applied to reinforce and taped in place.

## 2019-10-31 NOTE — Progress Notes (Signed)
Abd. distented and hyperactive bowel sounds, patient denies need to get to bsc to have a bm. Offered SSE but patient wants to wait until am.

## 2019-10-31 NOTE — Progress Notes (Addendum)
0900 Pt returned from chest xray with noted fluctating bubbling in chest tube reservoir. No subcutaneous emphysema; respirations easy/even/ Noted whistle sound with diminished breath sounds with chest tube/dsg intact. Dr. Reesa Chew and Dr. Genevive Bi notified and into check CT with no new orders or interventions.

## 2019-10-31 NOTE — Progress Notes (Signed)
PT Cancellation Note  Patient Details Name: David Cisneros MRN: [id number] DOB: [birth year]   Cancelled Treatment:    Reason Eval/Treat Not Completed: Pain limiting ability to participate   Pt in bed,  Waved writer away upon entering room.  Son in stating one chest tube was removed today and he is uncomfortable right now.  Pt agreed and declined session at this time.   [name] 10/31/2019, 11:16 AM

## 2019-10-31 NOTE — Progress Notes (Signed)
Left lateral s/p chest tube site outer dsgs changed for reinforced. Pt has not had results from magnesium citrate yet. Pt having chest tube site pain with morphine and tylenol. Will report to oncoming shift of need for SSE if no results from mag. Citrate.

## 2019-10-31 NOTE — Progress Notes (Signed)
Pt received from [facility]; left lateral chest tube in place, clamped. Second chest tube to anterior chest wall, draining to pleurovac currently on continuous wall suction as told in report. No orders noted. Call placed to covering surgeon, Dr. Perrin Maltese; Ok to keep anterior left chest tube on suction as it is, and keep lateral chest tube clamped, pt will be rounded on in AM as per sx. Pt resting comfortably, in no distress. Pt maintained on room air, denies shortness of breath or difficulty breathing.

## 2019-11-01 LAB — BASIC METABOLIC PANEL
Anion gap: 13 (ref 5–15)
BUN: 40 mg/dL — ABNORMAL HIGH (ref 8–23)
CO2: 27 mmol/L (ref 22–32)
Calcium: 8.5 mg/dL — ABNORMAL LOW (ref 8.9–10.3)
Chloride: 96 mmol/L — ABNORMAL LOW (ref 98–111)
Creatinine, Ser: 1.05 mg/dL (ref 0.61–1.24)
GFR calc Af Amer: >60 mL/min (ref >60)
GFR calc non Af Amer: >60 mL/min (ref >60)
Glucose, Bld: 209 mg/dL — ABNORMAL HIGH (ref 70–99)
Potassium: 4.5 mmol/L (ref 3.5–5.1)
Sodium: 136 mmol/L (ref 135–145)

## 2019-11-01 LAB — GLUCOSE, CAPILLARY
Glucose-Capillary: 190 mg/dL — ABNORMAL HIGH (ref 70–99)
Glucose-Capillary: 161 mg/dL — ABNORMAL HIGH (ref 70–99)
Glucose-Capillary: 220 mg/dL — ABNORMAL HIGH (ref 70–99)
Glucose-Capillary: 302 mg/dL — ABNORMAL HIGH (ref 70–99)

## 2019-11-01 LAB — CBC
HCT: 36.1 % — ABNORMAL LOW (ref 39.0–52.0)
Hemoglobin: 11.5 g/dL — ABNORMAL LOW (ref 13.0–17.0)
MCH: 29.3 pg (ref 26.0–34.0)
MCHC: 31.9 g/dL (ref 30.0–36.0)
MCV: 91.9 fL (ref 80.0–100.0)
Platelets: 376 10*3/uL (ref 150–400)
RBC: 3.93 MIL/uL — ABNORMAL LOW (ref 4.22–5.81)
RDW: 13.5 % (ref 11.5–15.5)
WBC: 16.6 10*3/uL — ABNORMAL HIGH (ref 4.0–10.5)
nRBC: 0 % (ref 0.0–0.2)

## 2019-11-01 NOTE — Progress Notes (Signed)
PROGRESS NOTE    David Cisneros  [id number] DOB: 1931/10/02 DOA: 10/21/2019 PCP: Valera Castle, MD   Brief Narrative:  [name] a 86 y.o.malewith medical history significant offormer smoker, hypertension, diabetes mellitus, rheumatoid arthritis, chronic back pain, CKD stage IIIa, who presents with shortness of breath. Found to have large left-sided pleural effusion.  Chest tube was placed by cardiothoracic surgery.  Lateral chest tube removed on 10/31/2019 and another anterior smaller bore chest tube was placed by IR on 10/30/2019. Thought to be due to rheumatoid arthritis.  Repeat CT chest concerning for underlying malignancy. Also developed ileus-initially managed with NG tube which was pulled out by patient on 10/24/2019.  Subjective: Patient was having some left-sided chest pain around the site of prior chest tube. He was also worried about his wife was in the same room after having a hip fracture SP internal fixation, she was having breathing issues.  Assessment & Plan:   Principal Problem:   Pleural effusion on left Active Problems:   Hyperkalemia   Hypertension   Chronic back pain   CKD (chronic kidney disease), stage IIIa   Leukocytosis   Pneumothorax on left   Rheumatoid arthritis (HCC)   Malnutrition of moderate degree  Left pleural effusion:Etiology unclear, likely secondary to underlying malignancy, he needs a biopsy, cardiothoracic surgeon has a long discussion with patient and son regarding getting a biopsy for tissue diagnosis.  Son does not think that his dad will be able to tolerate chemotherapy.  They will decide whether they want to pursue tissue diagnosis or not at this time as it will be fruitless if they do not want any further treatment.  S/p left chest tube placement. Per EDP,approximately 3 L of bloody fluid were removed. Pleural fluid analysis is lymphocyte predominant, cytology is neg for malignancy, Rheumatoid factors negative. Pleural  fluid cx NGTD.  Another smaller anterior chest tube was placed by IR.  Left lateral tube was removed yesterday by Dr. Christia Reading.  Patient will go home with anterior chest tube. There was some serosanguineous discharge on dressing from the site of left lateral tube.  Cardiothoracic surgery was paged by nursing staff, waiting for their response.  Repeat CT chest shows improvement in pleural effusion and pneumothorax but there was some nodules and pleural irregularities concerning for malignancy.  -Morphine prn for pain.  -Thoracic surgery following-appreciate their recommendations.   Pneumothorax on left: Mostly resolved. S/p left chest tube & draining bloody fluid.   Managed as per surgery.   Hyperkalemia: resolved.  Will continue to monitor   Ileus: etiology unclear. Pt pulled out NG tube 10/24/19 and the NG tube was left out. Evidently pt is passing gas but still no bowel movement yet. Continue on colace, dulcolax, milk of Mg & will give polyethylene glycol x1 today. Possible bloody bilious vomiting on evening on 10/22/19.  H&H are stable. GI signed off. Patient had a good BM last night with magnesium citrate. Patient is eating okay, no nausea or vomiting.  Abdomen was soft. -Magnesium citrate as needed. -Soapsuds enema as needed. -Continue with other bowel regimen..  Rheumatoid arthritis: at home onXeljanz and prednisone. He was initially treated with IV Solu-Medrol. -Start him on increased dose of prednisone 20 mg daily.  AKI on CKDIIIa. Improved to 1.1 today. -Continue to monitor.  Hypertension.  Not on any antihypertensives. Currently normotensive. -Monitor with as needed hydralazine.  Chronic back pain. -Continue morphine as needed.  Leukocytosis.  Most likely secondary to steroid use. Patient remained  afebrile. -Continue monitoring.  Generalized weakness.  PT recommending SNF. We had a discussion with son and as his wife recently broke her hip and had a surgery  yesterday.  We will try getting maximum home health services to see if they both can stay at home.  Objective: Vitals:   10/31/19 1212 10/31/19 1531 11/01/19 0008 11/01/19 0744  BP: 125/71 135/75 (!) 148/84 (!) 152/83  Pulse: 88 93 84 83  Resp: 16 17 16    Temp: 98.3 F (36.8 C) 97.6 F (36.4 C) 97.8 F (36.6 C) 98 F (36.7 C)  TempSrc: Oral Oral Oral Oral  SpO2: 95% 94% 93% 99%  Weight:      Height:        Intake/Output Summary (Last 24 hours) at 11/01/2019 1209 Last data filed at 11/01/2019 0953 Gross per 24 hour  Intake 337 ml  Output 340 ml  Net -3 ml   Filed Weights   10/21/19 1050 10/22/19 2241 10/28/19 1509  Weight: 72.6 kg 67.5 kg 67.1 kg    Examination:  General exam: Appears calm and comfortable  Respiratory system: Left-sided rhonchi, left-sided anterior chest tube in place respiratory effort normal. Cardiovascular system: S1 & S2 heard, RRR. No JVD, murmurs, rubs, gallops or clicks. Gastrointestinal system: Soft, nontender, nondistended, bowel sounds positive. Central nervous system: Alert and oriented. No focal neurological deficits.Symmetric 5 x 5 power. Extremities: No edema, no cyanosis, pulses intact and symmetrical. Psychiatry: Judgement and insight appear normal.    DVT prophylaxis: SCDs Code Status: DNR Family Communication: Son and wife both are present in the room. Disposition Plan: Pending improvement.  Consultants:   Cardiothoracic surgery  GI  IR  Procedures:   S/p chest tube placement on 10/21/19; s/p NG tube placement on 10/22/19 & pt pulled out NG tube 10/24/19  Lateral chest tube removed on 10/31/2019  Anterior chest tube placed on 10/30/2019   Antimicrobials:   Data Reviewed: I have personally reviewed following labs and imaging studies  CBC: Recent Labs  Lab 10/27/19 0551 10/28/19 0621 10/25/2019 0554 10/30/19 0441 11/01/19 0620  WBC 12.6* 15.4* 15.5* 12.9* 16.6*  HGB 10.3* 10.2* 10.7* 10.5* 11.5*  HCT 32.2* 31.9* 33.5*  33.0* 36.1*  MCV 91.5 92.2 92.5 93.0 91.9  PLT 474* 424* 380 379 [id number]   Basic Metabolic Panel: Recent Labs  Lab 10/26/19 0527 10/27/19 0551 10/28/19 0621 11/17/2019 0554 11/01/19 0620  NA 136 137 137 138 136  K 4.5 3.8 4.5 4.4 4.5  CL 93* 96* 98 99 96*  CO2 31 29 29 27 27   GLUCOSE 149* 161* 158* 156* 209*  BUN 38* 44* 35* 39* 40*  CREATININE 1.21 1.13 1.10 1.07 1.05  CALCIUM 8.8* 8.4* 8.4* 8.4* 8.5*  MG 2.2 2.2 2.0  --   --   PHOS 3.4 3.3 3.1  --   --    GFR: Estimated Creatinine Clearance: 43.9 mL/min (by C-G formula based on SCr of 1.05 mg/dL). Liver Function Tests: No results for input(s): AST, ALT, ALKPHOS, BILITOT, PROT, ALBUMIN in the last 168 hours. No results for input(s): LIPASE, AMYLASE in the last 168 hours. No results for input(s): AMMONIA in the last 168 hours. Coagulation Profile: Recent Labs  Lab 10/30/19 0441  INR 1.2   Cardiac Enzymes: No results for input(s): CKTOTAL, CKMB, CKMBINDEX, TROPONINI in the last 168 hours. BNP (last 3 results) No results for input(s): PROBNP in the last 8760 hours. HbA1C: No results for input(s): HGBA1C in the last 72 hours. CBG: Recent Labs  Lab 10/31/19 1218 10/31/19 1631 10/31/19 2123 11/01/19 0746 11/01/19 1203  GLUCAP 267* 266* 206* 190* 161*   Lipid Profile: No results for input(s): CHOL, HDL, LDLCALC, TRIG, CHOLHDL, LDLDIRECT in the last 72 hours. Thyroid Function Tests: No results for input(s): TSH, T4TOTAL, FREET4, T3FREE, THYROIDAB in the last 72 hours. Anemia Panel: No results for input(s): VITAMINB12, FOLATE, FERRITIN, TIBC, IRON, RETICCTPCT in the last 72 hours. Sepsis Labs: No results for input(s): PROCALCITON, LATICACIDVEN in the last 168 hours.  Recent Results (from the past 240 hour(s))  MRSA PCR Screening     Status: None   Collection Time: 10/22/19 10:53 PM   Specimen: Nasal Mucosa; Nasopharyngeal  Result Value Ref Range Status   MRSA by PCR NEGATIVE NEGATIVE Final    Comment:        The  GeneXpert MRSA Assay (FDA approved for NASAL specimens only), is one component of a comprehensive MRSA colonization surveillance program. It is not intended to diagnose MRSA infection nor to guide or monitor treatment for MRSA infections. Performed at [facility], [address] [place], [place] [address]      Radiology Studies: DG Chest 2 View  Result Date: 10/31/2019 CLINICAL DATA:  Chest tube in place. EXAM: CHEST - 2 VIEW COMPARISON:  Chest CT 10/26/2019 and chest radiographs 10/28/2019 FINDINGS: A left basilar chest tube remains in place posteriorly, and there is a new left pigtail chest tube anteriorly. The cardiomediastinal silhouette is unchanged. There is a persistent small loculated left pneumothorax which is most notable medially. Increased left basilar density likely reflects a small left pleural effusion and atelectasis. No consolidative opacity is present in the right lung. IMPRESSION: 1. Interval placement of a second left-sided chest tube with persistent small loculated left pneumothorax. 2. Left basilar atelectasis and small pleural effusion. Electronically Signed   By: Logan Bores M.D.   On: 10/31/2019 09:28   CT IMAGE GUIDED DRAINAGE BY PERCUTANEOUS CATHETER  Result Date: 10/30/2019 INDICATION: History of malignant pleural effusion, now with persistent pneumothorax despite surgically placed chest tube. Request made for placement of a new anterior approach chest tube within the more apical component of the persistent likely partially loculated left-sided pneumothorax. EXAM: CT IMAGE GUIDED DRAINAGE BY PERCUTANEOUS CATHETER COMPARISON:  None. MEDICATIONS: The patient is currently admitted to the hospital and receiving intravenous antibiotics. The antibiotics were administered within an appropriate time frame prior to the initiation of the procedure. ANESTHESIA/SEDATION: Moderate (conscious) sedation was employed during this procedure. A total of Versed 1 mg and  Fentanyl 50 mcg was administered intravenously. Moderate Sedation Time: 14 minutes. The patient's level of consciousness and vital signs were monitored continuously by radiology nursing throughout the procedure under my direct supervision. CONTRAST:  None COMPLICATIONS: None immediate. PROCEDURE: Informed written consent was obtained from the patient after a discussion of the risks, benefits and alternatives to treatment. The patient was placed supine on the CT gantry and a pre procedural CT was performed re-demonstrating the known small loculated left-sided pleural effusion. The procedure was planned utilizing the requested left anterior apical approach. A timeout was performed prior to the initiation of the procedure. The skin overlying the mid clavicular line of the left chest was prepped and draped in the usual sterile fashion. The overlying soft tissues were anesthetized with 1% lidocaine with epinephrine. An 18 gauge trocar needle was advanced into the left pleural space and a short Amplatz super stiff wire was coiled within the pleural space. Track was dilated and drainage catheter was  placed however imaging demonstrated malpositioning of the drainage catheter located within the anterior mediastinum. As such, the initial drainage catheter was removed intact. Planning imaging was negative for evidence of a mediastinal injury. As such, the next caudal anterior intercostal space was selected and again, after the overlying soft tissues were anesthetized with 1% lidocaine with epinephrine, an 18 gauge trocar needle was advanced into the left pleural space and a short Amplatz wire was coiled within the left pleural space. Appropriate position was confirmed with CT imaging. The track was dilated ultimately allowing placement of a 12 French drainage catheter with end ultimately coiled and locked appropriately within the anterior apical aspect of the left pleural space. The drainage catheter was connected to a pleura  vac device and secured in place within interrupted suture and a Stat Lock device. A Vaseline gauze was applied. The patient tolerated the procedure well without immediate postprocedural complication. IMPRESSION: Successful CT guided placement of a 57 French all purpose drain catheter into the anterior apical aspect of the left pleural space. Further chest tube management, both of the CT-guided chest tube as well as the pre-existing surgical chest tube, at the discretion of the providing cardiothoracic surgery service. Electronically Signed   By: Sandi Mariscal M.D.   On: 10/30/2019 15:39    Scheduled Meds: . bisacodyl  10 mg Rectal Once  . dextromethorphan-guaiFENesin  1 tablet Oral BID  . docusate sodium  200 mg Oral BID  . finasteride  5 mg Oral Daily  . insulin aspart  0-5 Units Subcutaneous QHS  . insulin aspart  0-9 Units Subcutaneous TID WC  . insulin aspart  4 Units Subcutaneous TID WC  . linaclotide  145 mcg Oral q morning - 10a  . magnesium hydroxide  30 mL Oral Daily  . multivitamin with minerals  1 tablet Oral Daily  . pantoprazole (PROTONIX) IV  40 mg Intravenous Q24H  . predniSONE  20 mg Oral Daily  . Ensure Max Protein  11 oz Oral BID BM  . scopolamine  1 patch Transdermal Q72H  . sodium chloride flush  3 mL Intravenous Q12H  . Tofacitinib Citrate  5 mg Oral BID   Continuous Infusions:   LOS: 10 days   Time spent: 35 min  Lorella Nimrod, MD [facility] Pager [phone]  If 7PM-7AM, please contact night-coverage [email or web address] Password [facility] 11/01/2019, 12:09 PM   This record has been created using Systems analyst. Errors have been sought and corrected,but may not always be located. Such creation errors do not reflect on the standard of care.

## 2019-11-01 NOTE — Progress Notes (Signed)
PT Cancellation Note  Patient Details Name: David Cisneros MRN: [id number] DOB: [birth year]   Cancelled Treatment:    Reason Eval/Treat Not Completed: Other (comment)   Pt sharing room with wife who is being seen by multiple disciplines for medical concerns.  Room busy and not an appropriate time to see pt this am.  Will continue as appropriate today but anticipate continue of therapy tomorrow.   [name] 11/01/2019, 11:26 AM

## 2019-11-01 NOTE — Progress Notes (Signed)
PT Cancellation Note  Patient Details Name: David Cisneros MRN: [id number] DOB: 1931/06/03   Cancelled Treatment:    Reason Eval/Treat Not Completed: Other (comment)  Declines session due to pain at chest tube, fatigue and worry over his wife.   [name] 11/01/2019, 1:32 PM

## 2019-11-02 DIAGNOSIS — I10 Essential (primary) hypertension: Secondary | ICD-10-CM

## 2019-11-02 DIAGNOSIS — N1831 Chronic kidney disease, stage 3a: Secondary | ICD-10-CM

## 2019-11-02 DIAGNOSIS — J90 Pleural effusion, not elsewhere classified: Secondary | ICD-10-CM

## 2019-11-02 DIAGNOSIS — G8929 Other chronic pain: Secondary | ICD-10-CM

## 2019-11-02 DIAGNOSIS — M545 Low back pain: Secondary | ICD-10-CM

## 2019-11-02 LAB — GLUCOSE, CAPILLARY: Glucose-Capillary: 157 mg/dL — ABNORMAL HIGH (ref 70–99)

## 2019-11-02 MED ORDER — POLYVINYL ALCOHOL 1.4 % OP SOLN
1.0000 [drp] | Freq: Four times a day (QID) | OPHTHALMIC | Status: DC | PRN
  Filled 2019-11-02: qty 15

## 2019-11-02 MED ORDER — DIPHENHYDRAMINE HCL 50 MG/ML IJ SOLN: 25.0000 mg | INTRAMUSCULAR | Status: DC | PRN

## 2019-11-02 MED ORDER — LORAZEPAM 2 MG/ML IJ SOLN: 2.0000 mg | INTRAMUSCULAR | Status: DC | PRN

## 2019-11-02 MED ORDER — HYDROMORPHONE HCL 2 MG PO TABS
2.0000 mg | ORAL_TABLET | ORAL | Status: DC | PRN
  Administered 2019-11-03 (×2): 2 mg via ORAL
  Filled 2019-11-02 (×3): qty 1

## 2019-11-02 MED ORDER — HALOPERIDOL LACTATE 5 MG/ML IJ SOLN: 2.5000 mg | INTRAMUSCULAR | Status: DC | PRN

## 2019-11-02 MED ORDER — DEXTROSE 5 % IV SOLN: INTRAVENOUS | Status: DC

## 2019-11-02 MED ORDER — MORPHINE SULFATE (PF) 2 MG/ML IV SOLN: 2.0000 mg | INTRAVENOUS | Status: DC | PRN

## 2019-11-02 MED ORDER — GLYCOPYRROLATE 0.2 MG/ML IJ SOLN
0.2000 mg | INTRAMUSCULAR | Status: DC | PRN
  Filled 2019-11-02: qty 1

## 2019-11-02 MED ORDER — ACETAMINOPHEN 650 MG RE SUPP: 650.0000 mg | Freq: Four times a day (QID) | RECTAL | Status: DC | PRN

## 2019-11-02 MED ORDER — GLYCOPYRROLATE 1 MG PO TABS
1.0000 mg | ORAL_TABLET | ORAL | Status: DC | PRN
  Filled 2019-11-02: qty 1

## 2019-11-02 MED ORDER — ACETAMINOPHEN 325 MG PO TABS: 650.0000 mg | ORAL_TABLET | Freq: Four times a day (QID) | ORAL | Status: DC | PRN

## 2019-11-02 MED ORDER — HYDROCODONE-ACETAMINOPHEN 5-325 MG PO TABS: 1.0000 | ORAL_TABLET | ORAL | Status: DC | PRN

## 2019-11-02 NOTE — Progress Notes (Signed)
PROGRESS NOTE    David Cisneros  [id number] DOB: 1931-07-07 DOA: 10/21/2019 PCP: Valera Castle, MD   Brief Narrative:  [name] a 86 y.o.malewith medical history significant offormer smoker, hypertension, diabetes mellitus, rheumatoid arthritis, chronic back pain, CKD stage IIIa, who presents with shortness of breath. Found to have large left-sided pleural effusion.  Chest tube was placed by cardiothoracic surgery.  Lateral chest tube removed on 10/31/2019 and another anterior smaller bore chest tube was placed by IR on 10/30/2019. Thought to be due to rheumatoid arthritis.  Repeat CT chest concerning for underlying malignancy. Also developed ileus-initially managed with NG tube which was pulled out by patient on 10/24/2019.  Subjective: Patient would like to be with his wife wherever she is and chooses to stay comfortable rather than active treatment. Wife in adjacent bed in same room   Assessment & Plan:   Principal Problem:   Pleural effusion on left Active Problems:   Hyperkalemia   Hypertension   Chronic back pain   CKD (chronic kidney disease), stage IIIa   Leukocytosis   Pneumothorax on left   Rheumatoid arthritis (HCC)   Malnutrition of moderate degree  Left pleural effusion:Etiology unclear, likely secondary to underlying malignancy, he needs a biopsy, cardiothoracic surgeon has a long discussion with patient and son regarding getting a biopsy for tissue diagnosis.  Son does not think that his dad will be able to tolerate chemotherapy.  Patient/son not interested in any agressive treatment  S/p left chest tube placement. Per EDP,approximately 3 L of bloody fluid were removed. Pleural fluid analysis is lymphocyte predominant, cytology is neg for malignancy, Rheumatoid factors negative. Pleural fluid cx NGTD.  Another smaller anterior chest tube was placed by IR.  Left lateral tube was removed yesterday by Dr. Christia Reading.  Patient will go home with anterior  chest tube. There was some serosanguineous discharge on dressing from the site of left lateral tube - chest tube to a Heimlich valve for more ease of care by Cardiothoracic surgery. Repeat CT chest shows improvement in pleural effusion and pneumothorax but there was some nodules and pleural irregularities concerning for malignancy. -Morphine prn for pain.  -Thoracic surgery following-appreciate their recommendations.   Pneumothorax on left: Mostly resolved. S/p left chest tube & draining bloody fluid.  chest tube to a Heimlich valve for more ease of care   Hyperkalemia: resolved.  Will continue to monitor   Ileus: now resolved.  Rheumatoid arthritis:   AKI on CKDIIIa. At baseline now  Hypertension.  Not on any antihypertensives. Currently normotensive. -Monitor with as needed hydralazine.  Chronic back pain. -Continue morphine as needed.  Leukocytosis.  Most likely secondary to steroid use. Patient remained afebrile. -Continue monitoring.  Generalized weakness.  PT recommending SNF but patient and son prefers to go home with Hospice.  I had a discussion with son and patient. Considering his wife not doing well (likely die) and they've been together for more than 14 yrs. Patient and son prefers to go home with Hospice so as he can spend quality time with wife.  Objective: Vitals:   11/01/19 0744 11/01/19 1708 11/01/19 2354 11/02/19 0746  BP: (!) 152/83 135/78 140/74 132/73  Pulse: 83 99 98 90  Resp:   18   Temp: 98 F (36.7 C)  98.3 F (36.8 C) 98.1 F (36.7 C)  TempSrc: Oral  Oral Oral  SpO2: 99% 93% 93% 93%  Weight:      Height:  Intake/Output Summary (Last 24 hours) at 11/02/2019 2013 Last data filed at 11/02/2019 1300 Gross per 24 hour  Intake 360 ml  Output 600 ml  Net -240 ml   Filed Weights   10/21/19 1050 10/22/19 2241 10/28/19 1509  Weight: 72.6 kg 67.5 kg 67.1 kg    Examination:  General exam: Appears calm and comfortable  Respiratory  system: Left-sided rhonchi, left-sided anterior chest tube in place respiratory effort normal. Cardiovascular system: S1 & S2 heard, RRR. No JVD, murmurs, rubs, gallops or clicks. Gastrointestinal system: Soft, nontender, nondistended, bowel sounds positive. Central nervous system: Alert and oriented. No focal neurological deficits.Symmetric 5 x 5 power. Extremities: No edema, no cyanosis, pulses intact and symmetrical. Psychiatry: Judgement and insight appear normal.    DVT prophylaxis: SCDs Code Status: DNR/comfort care Family Communication: Son and wife both are present in the room.  Disposition Plan: Home with Hospice tomorrow  Consultants:   Cardiothoracic surgery  GI  IR  Procedures:   S/p chest tube placement on 10/21/19; s/p NG tube placement on 10/22/19 & pt pulled out NG tube 10/24/19  Lateral chest tube removed on 10/31/2019  Anterior chest tube placed on 10/30/2019   Antimicrobials:   Data Reviewed: I have personally reviewed following labs and imaging studies  CBC: Recent Labs  Lab 10/27/19 0551 10/28/19 0621 11/02/2019 0554 10/30/19 0441 11/01/19 0620  WBC 12.6* 15.4* 15.5* 12.9* 16.6*  HGB 10.3* 10.2* 10.7* 10.5* 11.5*  HCT 32.2* 31.9* 33.5* 33.0* 36.1*  MCV 91.5 92.2 92.5 93.0 91.9  PLT 474* 424* 380 379 [id number]   Basic Metabolic Panel: Recent Labs  Lab 10/27/19 0551 10/28/19 0621 11/19/2019 0554 11/01/19 0620  NA 137 137 138 136  K 3.8 4.5 4.4 4.5  CL 96* 98 99 96*  CO2 29 29 27 27   GLUCOSE 161* 158* 156* 209*  BUN 44* 35* 39* 40*  CREATININE 1.13 1.10 1.07 1.05  CALCIUM 8.4* 8.4* 8.4* 8.5*  MG 2.2 2.0  --   --   PHOS 3.3 3.1  --   --    Coagulation Profile: Recent Labs  Lab 10/30/19 0441  INR 1.2   CBG: Recent Labs  Lab 11/01/19 0746 11/01/19 1203 11/01/19 1707 11/01/19 2118 11/02/19 0747  GLUCAP 190* 161* 220* 302* 157*     Radiology Studies: No results found.  Scheduled Meds: . bisacodyl  10 mg Rectal Once  .  dextromethorphan-guaiFENesin  1 tablet Oral BID  . magnesium hydroxide  30 mL Oral Daily  . multivitamin with minerals  1 tablet Oral Daily  . Ensure Max Protein  11 oz Oral BID BM  . scopolamine  1 patch Transdermal Q72H  . sodium chloride flush  3 mL Intravenous Q12H   Continuous Infusions: . dextrose       LOS: 11 days   Time spent: 35 min  Max Sane, MD [facility] Pager [phone]  If 7PM-7AM, please contact night-coverage [email or web address] Password [facility] 11/02/2019, 8:13 PM   This record has been created using Dragon voice recognition software. Errors have been sought and corrected,but may not always be located. Such creation errors do not reflect on the standard of care.

## 2019-11-02 NOTE — Progress Notes (Signed)
  Patient ID: David Cisneros, male   DOB: 1931-03-12, 86 y.o.   MRN: [id number]  HISTORY: Still has some pain at old chest tube site.  Not hungry.  Too weak to get out of bed.  Appears depressed.   Vitals:   11/01/19 2354 11/02/19 0746  BP: 140/74 132/73  Pulse: 98 90  Resp: 18   Temp: 98.3 F (36.8 C) 98.1 F (36.7 C)  SpO2: 93% 93%     EXAM:    Resp: Lungs are clear bilaterally.  No respiratory distress, normal effort. Heart:  Regular without murmurs Abd:  Abdomen is soft, non distended and non tender. No masses are palpable.  There is no rebound and no guarding.  Neurological: Alert and oriented to person, place, and time. Coordination normal.  Skin: Skin is warm and dry. No rash noted. No diaphoretic. No erythema. No pallor.  Psychiatric: Depressed mood. Normal behavior. Judgment and thought content normal.   No air leak seen today  ASSESSMENT: Probable malignant pleural effusion.     PLAN:   I converted his chest tube to a Heimlich valve for more ease of care.  Would ask Oncology and Palliative Care to see if their are any options for meaningful treatment.  Will continue to follow with you.    Nestor Lewandowsky, MDPatient ID: David Cisneros, male   DOB: 1930/11/30, 86 y.o.   MRN: [id number]

## 2019-11-02 NOTE — Care Management Important Message (Signed)
Important Message  Patient Details  Name: David Cisneros MRN: [id number] Date of Birth: 02-01-1931   Medicare Important Message Given:  Yes     David Cisneros 11/02/2019, 12:03 PM

## 2019-11-02 NOTE — Progress Notes (Signed)
Inpatient Diabetes Program Recommendations  AACE/ADA: New Consensus Statement on Inpatient Glycemic Control (2015)  Target Ranges:  Prepandial:   less than 140 mg/dL      Peak postprandial:   less than 180 mg/dL (1-2 hours)      Critically ill patients:  140 - 180 mg/dL   Results for ISAH, CATBAGAN (MRN [id number]) as of 11/02/2019 10:37  Ref. Range 11/01/2019 07:46 11/01/2019 12:03 11/01/2019 17:07 11/01/2019 21:18  Glucose-Capillary Latest Ref Range: 70 - 99 mg/dL 190 (H)  6 units NOVOLOG  161 (H)  6 units NOVOLOG  220 (H)  7 units NOVOLOG  302 (H)  4 units NOVOLOG    Results for FINNBAR, VARDA (MRN [id number]) as of 11/02/2019 10:37  Ref. Range 11/02/2019 07:47  Glucose-Capillary Latest Ref Range: 70 - 99 mg/dL 157 (H)  6 units NOVOLOG     Home DM meds: Amaryl 4 mg BID       Metformin 500 mg BID  Current Orders: Novolog Sensitive Correction Scale/ SSI (0-9 units) TID AC + HS      Novolog 4 units TID with meals     MD- Note patient getting Prednisone 20 mg Daily.  Please consider increasing Novolog Meal Coverage to: Novolog 6 units TID with meals     --Will follow patient during hospitalization--  Wyn Quaker RN, MSN, CDE Diabetes Coordinator Inpatient Glycemic Control Team Team Pager: [phone] (8a-5p)

## 2019-11-02 NOTE — Progress Notes (Signed)
   11/02/19 1100  Clinical Encounter Type  Visited With Patient;Family;Health care provider  Visit Type Follow-up  Referral From Physician   Chaplain received a referral to support this patient in the wake of him being made comfort care only. Upon arrival, the patient was resting in bed with the lights off. His eyes were closed, but he was easily awakened to verbal stimuli. His son was at the bedside and and his wife (also an inpatient here) was in a separate hospital bed in the shared room. The patient's reaffirmed that the patient was finally drifting off to sleep. This Probation officer acknowledged having met the patient's wife and son on Friday morning. No needs at this time, but the patient and his son expressed gratitude for the support given/offered.   Follow-up: Support the patient, his wife and their family.

## 2019-11-02 NOTE — Progress Notes (Signed)
PT Cancellation Note  Patient Details Name: David Cisneros MRN: [id number] DOB: Jul 18, 1931   Cancelled Treatment:    Reason Eval/Treat Not Completed: Patient declined, no reason specified.  Chart reviewed.  Nurse reports pt recently received IV pain meds (per chart pt received IV morphine at 0830).  Pt resting in bed upon PT arrival.  Attempted to encourage pt to participate in therapy but pt kept shaking his head no and pt stated "I just don't feel like it".  Will re-attempt PT treatment session at a later date/time.   Leitha Bleak, PT 11/02/19, 9:01 AM

## 2019-11-02 NOTE — Progress Notes (Signed)
OT Cancellation Note  Patient Details Name: MUAD KIRCH MRN: [id number] DOB: 1931-08-17   Cancelled Treatment:    Reason Eval/Treat Not Completed: Other (comment). Chart reviewed. Pt now comfort care only. Will complete therapy orders at this time. Please re-consult if additional needs arise.   Jeni Salles, MPH, MS, OTR/L ascom [phone] 11/02/19, 12:29 PM

## 2019-11-02 NOTE — Progress Notes (Addendum)
Patient asleep. Unit chaplain will visit later.

## 2019-11-02 NOTE — TOC Progression Note (Addendum)
Transition of Care [facility]) - Progression Note    Patient Details  Name: David Cisneros MRN: [id number] Date of Birth: [birth year]  Transition of Care [facility]) CM/SW [place], LCSW Phone Number: 11/02/2019, 2:06 PM  Clinical Narrative:  Plan is now for home with hospice. Met with patient and son to discuss. He has a relative that works for [employer] so their hospice agency will provide services. Discussed with their liaison. Patient will need hospital bed and bedside commode. He has a rollator and 4-wheel walker at home. Made liaison aware of new Heimlich Valve per cardiothoracic note. Will need to send patient home with some supplies until agency can get some delivered to the home. Patient will need EMS home. Confirmed address with son.   Expected Discharge Plan: [place] Barriers to Discharge: No Barriers Identified  Expected Discharge Plan and Services Expected Discharge Plan: [place]   Discharge Planning Services: CM Consult Post Acute Care Choice: [place] Living arrangements for the past 2 months: Single Family Home                                       Social Determinants of Health (SDOH) Interventions    Readmission Risk Interventions Readmission Risk Prevention Plan 10/28/2019  Transportation Screening Complete  PCP or Specialist Appt within 3-5 Days Complete  Palliative Care Screening Not Applicable  Medication Review (RN Care Manager) Complete  Some recent data might be hidden

## 2019-11-03 DIAGNOSIS — K810 Acute cholecystitis: Secondary | ICD-10-CM

## 2019-11-03 DIAGNOSIS — J984 Other disorders of lung: Secondary | ICD-10-CM

## 2019-11-03 DIAGNOSIS — Z515 Encounter for palliative care: Secondary | ICD-10-CM

## 2019-11-03 DIAGNOSIS — Z7189 Other specified counseling: Secondary | ICD-10-CM

## 2019-11-03 MED ORDER — HYDROMORPHONE HCL 2 MG PO TABS: 2.0000 mg | ORAL_TABLET | ORAL | 0 refills | Status: AC | PRN

## 2019-11-03 MED ORDER — LORAZEPAM 2 MG PO TABS: 2.0000 mg | ORAL_TABLET | ORAL | 0 refills | Status: AC | PRN

## 2019-11-03 MED ORDER — MORPHINE SULFATE 10 MG/5ML PO SOLN: 5.0000 mg | ORAL | 0 refills | Status: AC | PRN

## 2019-11-03 NOTE — Discharge Summary (Signed)
[facility] [place] NAME: David Cisneros    MR#:  [id number]  DATE OF BIRTH:  1930-12-09  DATE OF ADMISSION:  10/21/2019   ADMITTING PHYSICIAN: Wyvonnia Dusky, MD  DATE OF DISCHARGE: 11/03/2019  PRIMARY CARE PHYSICIAN: Valera Castle, MD   ADMISSION DIAGNOSIS:  Acute cholecystitis [K81.0] Pleural effusion on left [J90] Pleural effusion, left [J90] DISCHARGE DIAGNOSIS:  Principal Problem:   Pleural effusion, left Active Problems:   Hyperkalemia   Hypertension   Chronic back pain   CKD (chronic kidney disease), stage IIIa   Leukocytosis   Pneumothorax on left   Rheumatoid arthritis (HCC)   Malnutrition of moderate degree   Acute cholecystitis   Pulmonary disease  SECONDARY DIAGNOSIS:   Past Medical History:  Diagnosis Date  . Cancer ([place])    skin   . Chronic back pain   . Collagen vascular disease (HCC)    RA  . Diabetes mellitus without complication ([place])   . Hypertension    HOSPITAL COURSE:  David Cisneros is a 86 y.o. male with medical history significant of former smoker, hypertension, diabetes mellitus, rheumatoid arthritis, chronic back pain, CKD stage IIIa, admitted for shortness of breath.  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 Patient will go home with anterior chest tube. chest tube to a Heimlich valve for more ease of care by Cardiothoracic surgery.  Pneumothorax on left: s/p left chest tube - chest tube to a Heimlich valve for more ease of care   Hyperkalemia: Ileus: Rheumatoid arthritis:  AKI on CKDIIIa. Hypertension Chronic back pain. Leukocytosis Generalized weakness  Patient and family/son agreeable for Home with Hospice. DISCHARGE CONDITIONS:  fair CONSULTS OBTAINED:  Treatment Team:  Ronny Bacon, MD Nestor Lewandowsky, MD DRUG ALLERGIES:   Allergies  Allergen Reactions  . Sulfa Antibiotics Rash   DISCHARGE MEDICATIONS:   Allergies as of 11/03/2019        Reactions   Sulfa Antibiotics Rash      Medication List    STOP taking these medications   finasteride 5 MG tablet Commonly known as: PROSCAR   glimepiride 4 MG tablet Commonly known as: AMARYL   Linzess 145 MCG Caps capsule Generic drug: linaclotide   metFORMIN 500 MG tablet Commonly known as: GLUCOPHAGE   predniSONE 5 MG tablet Commonly known as: DELTASONE   Xeljanz 5 MG Tabs Generic drug: Tofacitinib Citrate     TAKE these medications   HYDROmorphone 2 MG tablet Commonly known as: Dilaudid Take 1 tablet (2 mg total) by mouth every 4 (four) hours as needed for severe pain.   LORazepam 2 MG tablet Commonly known as: Ativan Take 1 tablet (2 mg total) by mouth every 2 (two) hours as needed for anxiety, sedation or sleep.   morphine 10 MG/5ML solution Take 2.5 mLs (5 mg total) by mouth every 2 (two) hours as needed for severe pain.        DISCHARGE INSTRUCTIONS:   DIET:  Regular diet DISCHARGE CONDITION:  Fair ACTIVITY:  Activity as tolerated OXYGEN:  Home Oxygen: No.  Oxygen Delivery: room air DISCHARGE LOCATION:  home with Hospice  If you experience worsening of your admission symptoms, develop shortness of breath, life threatening emergency, suicidal or homicidal thoughts you must seek medical attention immediately by calling 911 or calling your MD immediately  if symptoms less severe.  You Must read complete instructions/literature along with all the possible adverse reactions/side effects for all the Medicines  you take and that have been prescribed to you. Take any new Medicines after you have completely understood and accpet all the possible adverse reactions/side effects.   Please note  You were cared for by a hospitalist during your hospital stay. If you have any questions about your discharge medications or the care you received while you were in the hospital after you are discharged, you can call the unit and asked to speak with the  hospitalist on call if the hospitalist that took care of you is not available. Once you are discharged, your primary care physician will handle any further medical issues. Please note that NO REFILLS for any discharge medications will be authorized once you are discharged, as it is imperative that you return to your primary care physician (or establish a relationship with a primary care physician if you do not have one) for your aftercare needs so that they can reassess your need for medications and monitor your lab values.    On the day of Discharge:  VITAL SIGNS:  Blood pressure 123/68, pulse 93, temperature 98.6 F (37 C), temperature source Oral, resp. rate 17, height 5\' 5"  (1.651 m), weight 67.1 kg, SpO2 93 %. PHYSICAL EXAMINATION:  GENERAL:  86 y.o.-year-old patient lying in the bed with no acute distress.  EYES: Pupils equal, round, reactive to light and accommodation. No scleral icterus. Extraocular muscles intact.  HEENT: Head atraumatic, normocephalic. Oropharynx and nasopharynx clear.  NECK:  Supple, no jugular venous distention. No thyroid enlargement, no tenderness.  LUNGS: Normal breath sounds bilaterally, no wheezing, rales,rhonchi or crepitation. No use of accessory muscles of respiration.  CARDIOVASCULAR: S1, S2 normal. No murmurs, rubs, or gallops.  ABDOMEN: Soft, non-tender, non-distended. Bowel sounds present. No organomegaly or mass.  EXTREMITIES: No pedal edema, cyanosis, or clubbing.  NEUROLOGIC: Cranial nerves II through XII are intact. Muscle strength 5/5 in all extremities. Sensation intact. Gait not checked.  PSYCHIATRIC: The patient is alert and oriented x 3.  SKIN: No obvious rash, lesion, or ulcer.  DATA REVIEW:   CBC Recent Labs  Lab 11/01/19 0620  WBC 16.6*  HGB 11.5*  HCT 36.1*  PLT 376    Chemistries  Recent Labs  Lab 10/28/19 0621 11/01/19 0620  NA 137 136  K 4.5 4.5  CL 98 96*  CO2 29 27  GLUCOSE 158* 209*  BUN 35* 40*  CREATININE 1.10  1.05  CALCIUM 8.4* 8.5*  MG 2.0  --     Follow-up Information    Valera Castle, MD. Schedule an appointment as soon as possible for a visit in 7 day(s).   Specialty: Family Medicine Why: Please follow up in 5-7 days of discharge Contact information: [place] [address] [phone]           Management plans discussed with the patient, family and they are in agreement.  CODE STATUS: DNR/comfort care  TOTAL TIME TAKING CARE OF THIS PATIENT: 45 minutes.    Max Sane M.D on 11/03/2019 at 10:10 AM  Between 7am to 6pm - Pager - [phone]  After 6pm go to [email or web address] - password [email or web address]  [facility]   CC: Primary care physician; Valera Castle, MD   Note: This dictation was prepared with Dragon dictation along with smaller phrase technology. Any transcriptional errors that result from this process are unintentional.

## 2019-11-03 NOTE — Discharge Instructions (Signed)
Hospice Hospice is a service that is designed to provide people who are terminally ill and their families with medical, spiritual, and psychological support. Its aim is to improve your quality of life by keeping you as comfortable as possible in the final stages of life. Who will be my providers when I begin hospice care? Hospice teams often include:  A nurse.  A doctor. The hospice doctor will be available for your care, but you can include your regular doctor or nurse practitioner.  A social worker.  A counselor.  A religious leader (such as a chaplain).  A dietitian.  Therapists.  Trained volunteers who can help with care. What services does hospice provide? Hospice services can vary depending on the center or organization. Generally, they include:  Ways to keep you comfortable, such as: ? Providing care in your home or in a home-like setting. ? Working with your family and friends to help meet your needs. ? Allowing you to enjoy the support of loved ones by receiving much of your basic care from family and friends.  Pain relief and symptom management. The staff will supply all necessary medicines and equipment so that you can stay comfortable and alert enough to enjoy the company of your friends and family.  Visits or care from a nurse and doctor. This may include 24-hour on-call services.  Companionship when you are alone.  Allowing you and your family to rest. Hospice staff may do light housekeeping, prepare meals, and run errands.  Counseling. They will make sure your emotional, spiritual, and social needs are being met, as well as those needs of your family members.  Spiritual care. This will be individualized to meet your needs and your family's needs. It may involve: ? Helping you and your family understand the dying process. ? Helping you say goodbye to your family and friends. ? Performing a specific religious ceremony or ritual.  Massage.  Nutrition  therapy.  Physical and occupational therapy.  Short-term inpatient care, if something cannot be managed in the home.  Art or music therapy.  Bereavement support for grieving family members. When should hospice care begin? Most people who use hospice are believed to have less than 6 months to live.  Your family and health care providers can help you decide when hospice services should begin.  If you live longer than 6 months but your condition does not improve, your doctor may be able to approve you for continued hospice care.  If your condition improves, you may discontinue the program. What should I consider before selecting a program? Most hospice programs are run by nonprofit, independent organizations. Some are affiliated with hospitals, nursing homes, or home health care agencies. Hospice programs can take place in your home or at a hospice center, hospital, or skilled nursing facility. When choosing a hospice program, ask the following questions:  What services are available to me?  What services will be offered to my loved ones?  How involved will my loved ones be?  How involved will my health care provider be?  Who makes up the hospice care team? How are they trained or screened?  How will my pain and symptoms be managed?  If my circumstances change, can the services be provided in a different setting, such as my home or in the hospital?  Is the program reviewed and licensed by the state or certified in some other way?  What does it cost? Is it covered by insurance?  If I choose a hospice   center or nursing home, where is the hospice center located? Is it convenient for family and friends?  If I choose a hospice center or nursing home, can my family and friends visit any time?  Will you provide emotional and spiritual support?  Who can my family call with questions? Where can I learn more about hospice? You can learn about existing hospice programs in your area  from your health care providers. You can also read more about hospice online. The websites of the following organizations have helpful information:  [facility] and Palliative Care Organization [facility]): http://www.brown-buchanan.com/  National Association for [facility]): http://massey-hart.com/  Hospice Foundation of America ([place]): www.hospicefoundation.org  American Cancer Society (ACS): www.cancer.org  Hospice Net: www.hospicenet.org  Visiting Nurse Associations of [place] (VNAA): www.vnaa.org You may also find more information by contacting the following agencies:  A local agency on aging.  Your local [employer] chapter.  Your state's department of health or social services. Summary  Hospice is a service that is designed to provide people who are terminally ill and their families with medical, spiritual, and psychological support.  Hospice aims to improve your quality of life by keeping you as comfortable as possible in the final stages of life.  Hospice teams often include a doctor, nurse, social worker, counselor, religious leader,dietitian, therapists, and volunteers.  Hospice care generally includes medicine for symptom management, visits from doctors and nurses, physical and occupational therapy, nutrition counseling, spiritual and emotional counseling, caregiver support, and bereavement support for grieving family members.  Hospice programs can take place in your home or at a hospice center, hospital, or skilled nursing facility. This information is not intended to replace advice given to you by your health care provider. Make sure you discuss any questions you have with your health care provider. Document Revised: 07/01/2019 Document Reviewed: 10/30/2016 Elsevier Patient Education  [place].

## 2019-11-03 NOTE — TOC Transition Note (Signed)
Transition of Care [facility]) - CM/SW Discharge Note   Patient Details  Name: David Cisneros MRN: [id number] Date of Birth: 02/26/1931  [facility] of [facility]) CM/SW Contact:  Candie Chroman, LCSW Phone Number: 11/03/2019, 11:33 AM   Clinical Narrative:  Patient has orders to discharge home with hospice through [facility] today. Son will transport him home. DME should be delivered around noon. No further concerns. CSW signing off.   Final next level of care: Home w Hospice Care Barriers to Discharge: Barriers Resolved   Patient Goals and CMS Choice     Choice offered to / list presented to : Patient, Adult Children  Discharge Placement                Patient to be transferred to facility by: Son will transport by car. Name of family member notified: Rhylee Worthing Patient and family notified of of transfer: 11/03/19  Discharge Plan and Services   Discharge Planning Services: CM Consult Post Acute Care Choice: [place]                               Social Determinants of Health (SDOH) Interventions     Readmission Risk Interventions Readmission Risk Prevention Plan 10/28/2019  Transportation Screening Complete  PCP or Specialist Appt within 3-5 Days Complete  Palliative Care Screening Not Applicable  Medication Review (RN Care Manager) Complete  Some recent data might be hidden

## 2019-11-03 NOTE — Plan of Care (Signed)
  Problem: Education: Goal: Knowledge of General Education information will improve Description: Including pain rating scale, medication(s)/side effects and non-pharmacologic comfort measures Outcome: Progressing   Problem: Health Behavior/Discharge Planning: Goal: Ability to manage health-related needs will improve Outcome: Progressing   Problem: Clinical Measurements: Goal: Will remain free from infection Outcome: Progressing   Problem: Clinical Measurements: Goal: Respiratory complications will improve Outcome: Progressing   Problem: Clinical Measurements: Goal: Cardiovascular complication will be avoided Outcome: Progressing   Problem: Activity: Goal: Risk for activity intolerance will decrease Outcome: Progressing

## 2019-11-03 NOTE — Consult Note (Signed)
Consultation Note Date: 11/03/2019   Patient Name: David Cisneros  DOB: 08-20-1931  MRN: [id number]  Age / Sex: 86 y.o., male  PCP: Valera Castle, MD Referring Physician: Max Sane, MD  Reason for Consultation: Establishing goals of care  HPI/Patient Profile: David Cisneros is a 86 y.o. male with medical history significant of former smoker, hypertension, diabetes mellitus, rheumatoid arthritis, chronic back pain, CKD stage IIIa, who presents with shortness of breath.   Clinical Assessment and Goals of Care: Patient is resting in bed. Pigtail chest tube in place. Wife is in bed next to him as this is a semiprivate room. She is on BIPAP at 50%. Son is present. Son and husband both worried about Mrs. Closson being able to D/C. Per the two, Mr. And Mrs. Fruehauf are both discharging home with hospice. They are frustrated with insurance issues. Discharge plan in place. [facility] to follow.      SUMMARY OF RECOMMENDATIONS   Continue current plan for D/C with hospice as already determined.        Primary Diagnoses: Present on Admission: . Hyperkalemia . Hypertension . Chronic back pain . CKD (chronic kidney disease), stage IIIa . Leukocytosis . Pneumothorax on left . Rheumatoid arthritis ([place])   I have reviewed the medical record, interviewed the patient and family, and examined the patient. The following aspects are pertinent.  Past Medical History:  Diagnosis Date  . Cancer ([place])    skin   . Chronic back pain   . Collagen vascular disease ([facility])    RA  . Diabetes mellitus without complication ([place])   . Hypertension    Social History   Socioeconomic History  . Marital status: Married    Spouse name: Not on file  . Number of children: Not on file  . Years of education: Not on file  . Highest education level: Not on file  Occupational History  . Not on file  Tobacco Use  . Smoking  status: Never Smoker  . Smokeless tobacco: Never Used  Substance and Sexual Activity  . Alcohol use: Not Currently  . Drug use: Not Currently  . Sexual activity: Not Currently  Other Topics Concern  . Not on file  Social History Narrative  . Not on file   Social Determinants of Health   Financial Resource Strain:   . Difficulty of Paying Living Expenses: Not on file  Food Insecurity:   . Worried About Charity fundraiser in the Last Year: Not on file  . Ran Out of Food in the Last Year: Not on file  Transportation Needs:   . Lack of Transportation (Medical): Not on file  . Lack of Transportation (Non-Medical): Not on file  Physical Activity:   . Days of Exercise per Week: Not on file  . Minutes of Exercise per Session: Not on file  Stress:   . Feeling of Stress : Not on file  Social Connections:   . Frequency of Communication with Friends and Family: Not on file  .  Frequency of Social Gatherings with Friends and Family: Not on file  . Attends Religious Services: Not on file  . Active Member of Clubs or Organizations: Not on file  . Attends Archivist Meetings: Not on file  . Marital Status: Not on file   Family History  Problem Relation Age of Onset  . Breast cancer Sister   . Heart attack Brother    Scheduled Meds: . bisacodyl  10 mg Rectal Once  . dextromethorphan-guaiFENesin  1 tablet Oral BID  . magnesium hydroxide  30 mL Oral Daily  . multivitamin with minerals  1 tablet Oral Daily  . Ensure Max Protein  11 oz Oral BID BM  . scopolamine  1 patch Transdermal Q72H  . sodium chloride flush  3 mL Intravenous Q12H   Continuous Infusions: . dextrose     PRN Meds:.acetaminophen **OR** acetaminophen, acetaminophen, diphenhydrAMINE, glycopyrrolate **OR** glycopyrrolate **OR** glycopyrrolate, haloperidol lactate, HYDROcodone-acetaminophen, HYDROmorphone, LORazepam, morphine injection, morphine injection, ondansetron (ZOFRAN) IV, polyethylene glycol, polyvinyl  alcohol, sodium chloride flush Medications Prior to Admission:  Prior to Admission medications   Medication Sig Start Date End Date Taking? Authorizing Provider  finasteride (PROSCAR) 5 MG tablet Take 5 mg by mouth daily. 09/11/19  Yes [provider]  glimepiride (AMARYL) 4 MG tablet Take 4 mg by mouth 2 (two) times daily. 09/21/19  Yes [provider]  LINZESS 145 MCG CAPS capsule Take 145 mcg by mouth every morning. 10/01/19  Yes [provider]  metFORMIN (GLUCOPHAGE) 500 MG tablet Take 500 mg by mouth 2 (two) times daily. 09/28/19  Yes [provider]  predniSONE (DELTASONE) 5 MG tablet Take 10 mg by mouth daily. 10/09/19  Yes [provider]  XELJANZ 5 MG TABS Take 5 mg by mouth 2 (two) times daily. 10/06/19  Yes [provider]  HYDROmorphone (DILAUDID) 2 MG tablet Take 1 tablet (2 mg total) by mouth every 4 (four) hours as needed for severe pain. 11/03/19   Max Sane, MD  LORazepam (ATIVAN) 2 MG tablet Take 1 tablet (2 mg total) by mouth every 2 (two) hours as needed for anxiety, sedation or sleep. 11/03/19   Max Sane, MD  morphine 10 MG/5ML solution Take 2.5 mLs (5 mg total) by mouth every 2 (two) hours as needed for severe pain. 11/03/19   Max Sane, MD   Allergies  Allergen Reactions  . Sulfa Antibiotics Rash   Review of Systems  All other systems reviewed and are negative.   Physical Exam Pulmonary:     Effort: Pulmonary effort is normal.  Skin:    General: Skin is warm and dry.  Neurological:     Mental Status: He is alert.     Vital Signs: BP 123/68 (BP Location: Left Arm)   Pulse 93   Temp 98.6 F (37 C) (Oral)   Resp 17   Ht 5\' 5"  (1.651 m)   Wt 67.1 kg   SpO2 93%   BMI 24.62 kg/m  Pain Scale: 0-10 POSS *See Group Information*: S-Acceptable,Sleep, easy to arouse Pain Score: 2    SpO2: SpO2: 93 % O2 Device:SpO2: 93 % O2 Flow Rate: .O2 Flow Rate (L/min): 2 L/min  IO: Intake/output summary:    Intake/Output Summary (Last 24 hours) at 11/03/2019 1345 Last data filed at 11/03/2019 1104 Gross per 24 hour  Intake --  Output 790 ml  Net -790 ml    LBM: Last BM Date: 10/31/19 Baseline Weight: Weight: 72.6 kg Most recent weight: Weight: 67.1  kg     Palliative Assessment/Data:     Time In: 1:20 Time Out: 1:50 Time Total: 30 min Greater than 50%  of this time was spent counseling and coordinating care related to the above assessment and plan.  Signed by: Asencion Gowda, NP   Please contact Palliative Medicine Team phone at [phone] for questions and concerns.  For individual provider: See Shea Evans

## 2019-11-03 NOTE — Progress Notes (Signed)
Discharge instructions reviewed with patient and his son. They verbalized understanding. IVs removed. Vitals stable. NT helping son dress patient and he will be taking him home.

## 2019-11-12 LAB — MISC LABCORP TEST (SEND OUT): Labcorp test code: 9985

## 2019-11-23 DEATH — deceased

## 2020-04-15 IMAGING — CT CT IMAGE GUIDED DRAINAGE BY PERCUTANEOUS CATHETER
2 of 8 series · 11 of 32 positions shown, 16 images · non-contrast
Comparison: None.

INDICATION: History of malignant pleural effusion, now with persistent
pneumothorax despite surgically placed chest tube. Request made for
placement of a new anterior approach chest tube within the more
apical component of the persistent likely partially loculated
left-sided pneumothorax.

EXAM:
CT IMAGE GUIDED DRAINAGE BY PERCUTANEOUS CATHETER

[Series 3: i-spiral 5.0 b30f · axial · 0.66mm/px · z∈[-78,+48]mm · 5 of 55 slices shown (1 of 2)]
[im 10/55  soft-tissue]
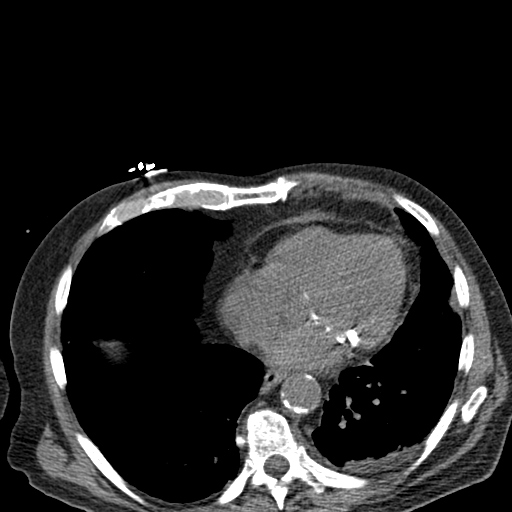
[im 19/55  soft-tissue]
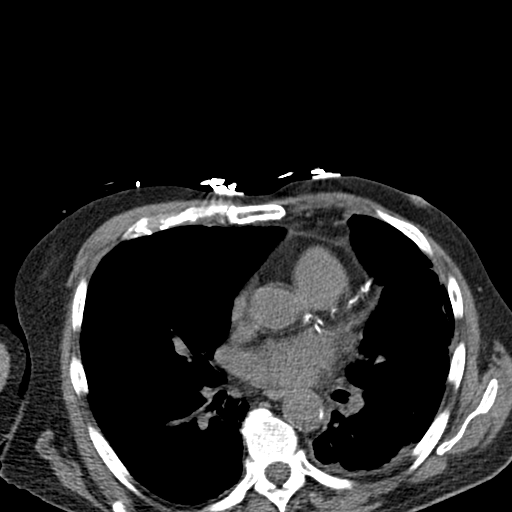
[im 28/55  soft-tissue]
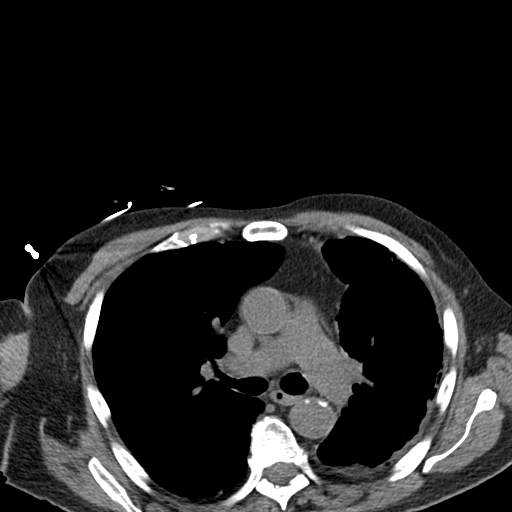
[im 37/55  soft-tissue]
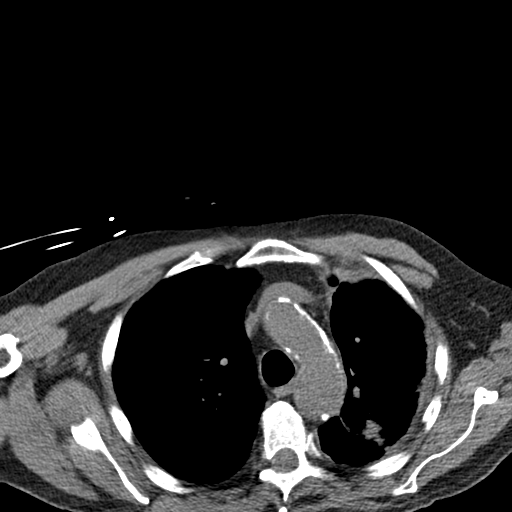
[im 46/55  soft-tissue]
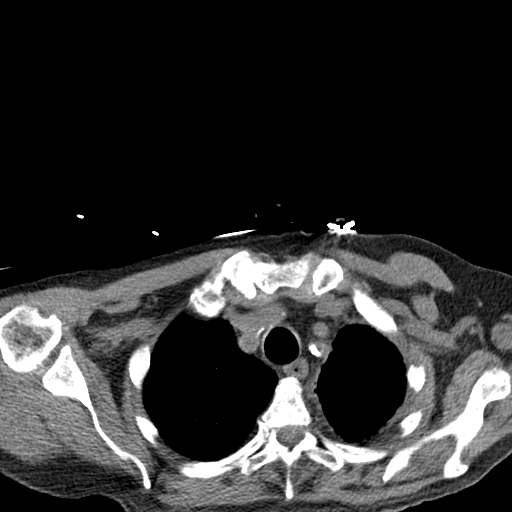

[Series 7: i-spiral 5.0 b30f · axial · 0.66mm/px · z∈[-113,+52]mm · 6 of 67 slices shown, 11 images (2 of 2)]
[im 10/67  soft-tissue]
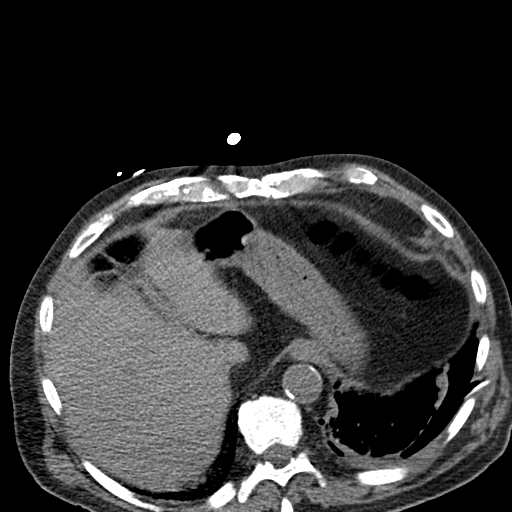
[im 10/67  bone]
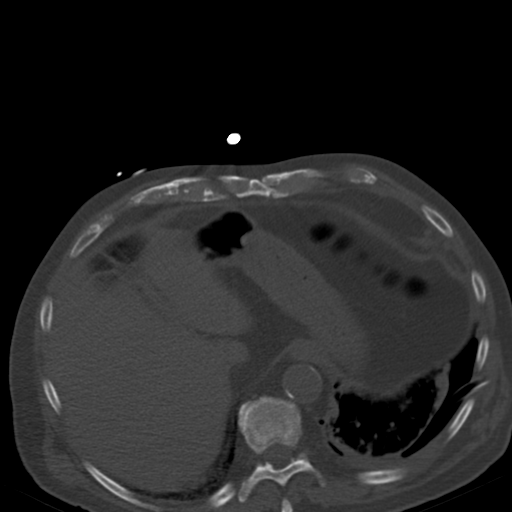
[im 19/67  soft-tissue]
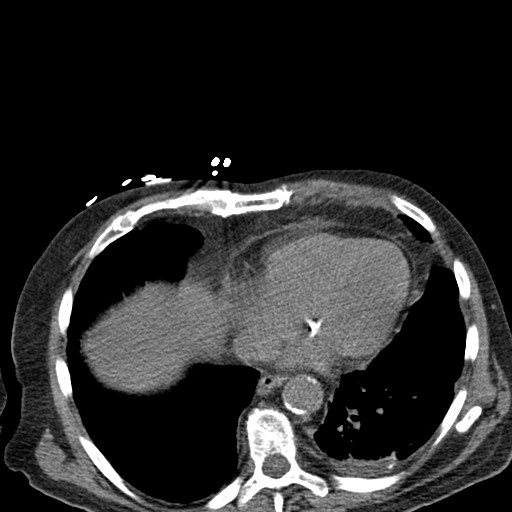
[im 29/67  soft-tissue]
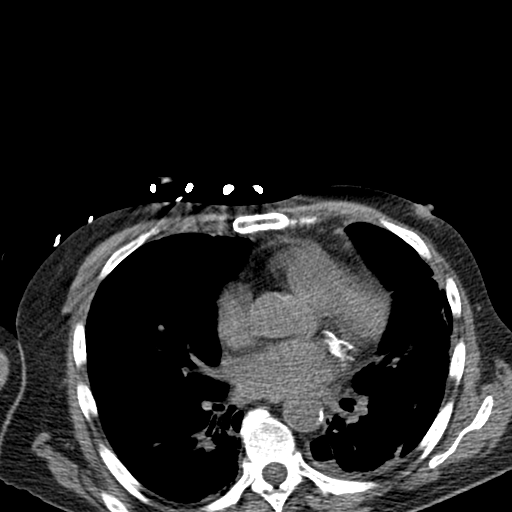
[im 29/67  lung]
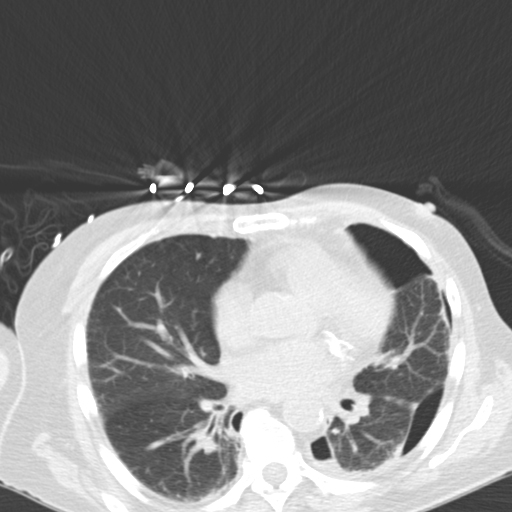
[im 38/67  soft-tissue]
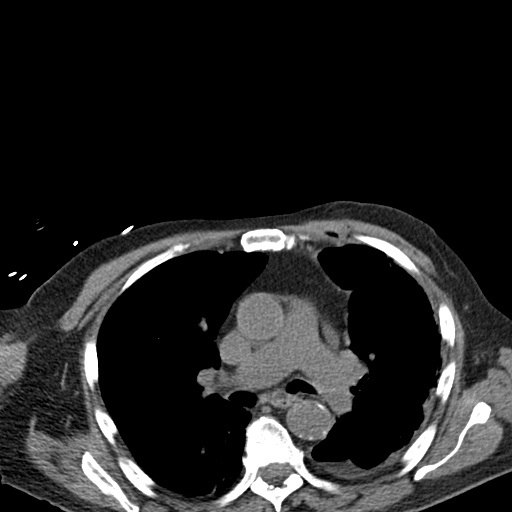
[im 38/67  lung]
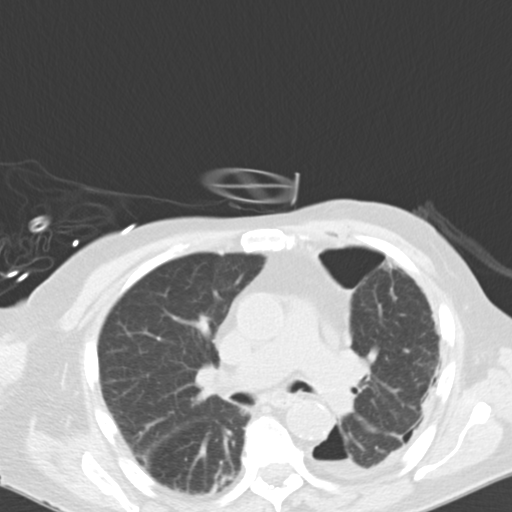
[im 48/67  soft-tissue]
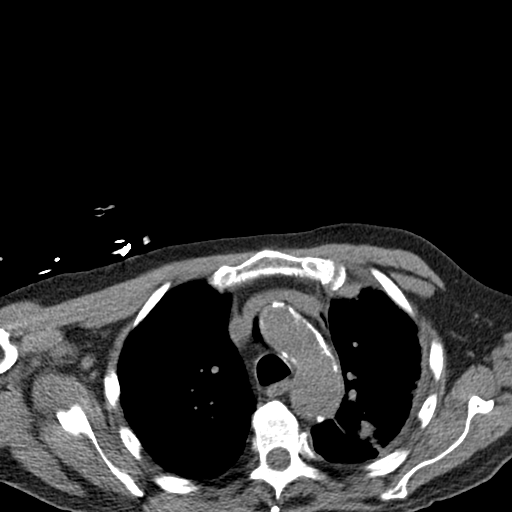
[im 48/67  lung]
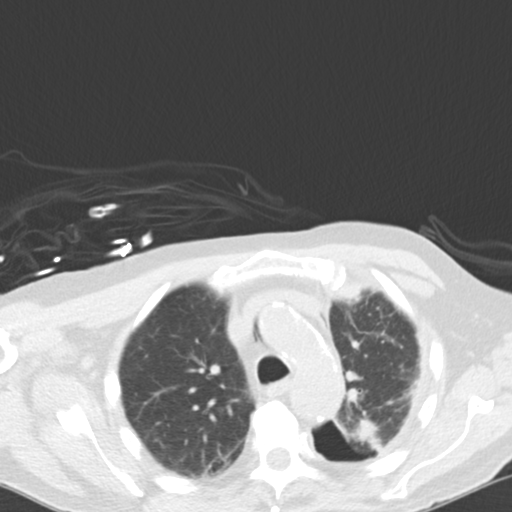
[im 57/67  soft-tissue]
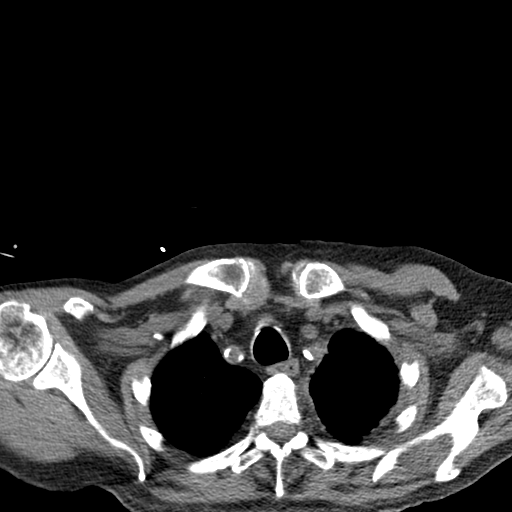
[im 57/67  lung]
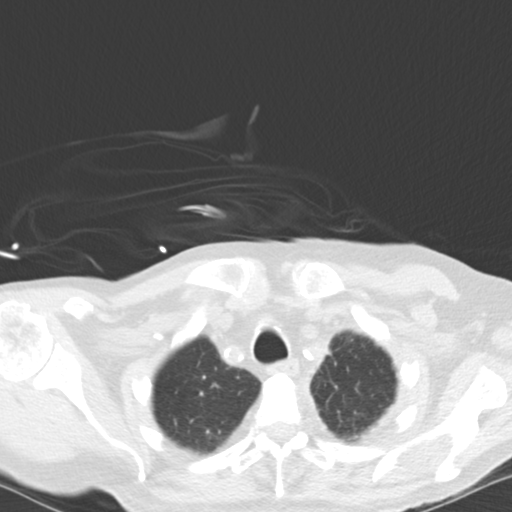

[11 of 32 positions shown; findings below may reference images not displayed]

MEDICATIONS:
The patient is currently admitted to the hospital and receiving
intravenous antibiotics. The antibiotics were administered within an
appropriate time frame prior to the initiation of the procedure.

ANESTHESIA/SEDATION:
Moderate (conscious) sedation was employed during this procedure. A
total of Versed 1 mg and Fentanyl 50 mcg was administered
intravenously.

Moderate Sedation Time: 14 minutes. The patient's level of
consciousness and vital signs were monitored continuously by
radiology nursing throughout the procedure under my direct
supervision.

CONTRAST:  None

COMPLICATIONS:
None immediate.

PROCEDURE:
Informed written consent was obtained from the patient after a
discussion of the risks, benefits and alternatives to treatment. The
patient was placed supine on the CT gantry and a pre procedural CT
was performed re-demonstrating the known small loculated left-sided
pleural effusion. The procedure was planned utilizing the requested
left anterior apical approach. A timeout was performed prior to the
initiation of the procedure.

The skin overlying the mid clavicular line of the left chest was
prepped and draped in the usual sterile fashion. The overlying soft
tissues were anesthetized with 1% lidocaine with epinephrine. An 18
gauge trocar needle was advanced into the left pleural space and a
short Amplatz super stiff wire was coiled within the pleural space.
Track was dilated and drainage catheter was placed however imaging
demonstrated malpositioning of the drainage catheter located within
the anterior mediastinum. As such, the initial drainage catheter was
removed intact. Planning imaging was negative for evidence of a
mediastinal injury.

As such, the next caudal anterior intercostal space was selected and
again, after the overlying soft tissues were anesthetized with 1%
lidocaine with epinephrine, an 18 gauge trocar needle was advanced
into the left pleural space and a short Amplatz wire was coiled
within the left pleural space. Appropriate position was confirmed
with CT imaging.

The track was dilated ultimately allowing placement of a 12 French
drainage catheter with end ultimately coiled and locked
appropriately within the anterior apical aspect of the left pleural
space.

The drainage catheter was connected to a pleura vac device and
secured in place within interrupted suture and a Stat Lock device.

A Vaseline gauze was applied. The patient tolerated the procedure
well without immediate postprocedural complication.
IMPRESSION: Successful CT guided placement of a 12 French all purpose drain
catheter into the anterior apical aspect of the left pleural space.

Further chest tube management, both of the CT-guided chest tube as
well as the pre-existing surgical chest tube, at the discretion of
the providing cardiothoracic surgery service.
